# Patient Record
Sex: Female | Born: 1970 | Race: White | Hispanic: No | Marital: Single | Smoking: Current every day smoker
Health system: Southern US, Community
[De-identification: ages and names within clinical notes are randomized; demographics above are authoritative.]

## PROBLEM LIST (undated history)

## (undated) DIAGNOSIS — K219 Gastro-esophageal reflux disease without esophagitis: Secondary | ICD-10-CM

## (undated) DIAGNOSIS — G43909 Migraine, unspecified, not intractable, without status migrainosus: Secondary | ICD-10-CM

## (undated) DIAGNOSIS — F4310 Post-traumatic stress disorder, unspecified: Secondary | ICD-10-CM

## (undated) DIAGNOSIS — M797 Fibromyalgia: Secondary | ICD-10-CM

## (undated) DIAGNOSIS — K8020 Calculus of gallbladder without cholecystitis without obstruction: Secondary | ICD-10-CM

## (undated) DIAGNOSIS — G4730 Sleep apnea, unspecified: Secondary | ICD-10-CM

## (undated) DIAGNOSIS — M1990 Unspecified osteoarthritis, unspecified site: Secondary | ICD-10-CM

## (undated) DIAGNOSIS — I10 Essential (primary) hypertension: Secondary | ICD-10-CM

## (undated) DIAGNOSIS — K56609 Unspecified intestinal obstruction, unspecified as to partial versus complete obstruction: Secondary | ICD-10-CM

## (undated) DIAGNOSIS — K449 Diaphragmatic hernia without obstruction or gangrene: Secondary | ICD-10-CM

## (undated) DIAGNOSIS — F419 Anxiety disorder, unspecified: Secondary | ICD-10-CM

## (undated) DIAGNOSIS — J45909 Unspecified asthma, uncomplicated: Secondary | ICD-10-CM

## (undated) DIAGNOSIS — E119 Type 2 diabetes mellitus without complications: Secondary | ICD-10-CM

## (undated) DIAGNOSIS — F32A Depression, unspecified: Secondary | ICD-10-CM

## (undated) DIAGNOSIS — K602 Anal fissure, unspecified: Secondary | ICD-10-CM

## (undated) DIAGNOSIS — F319 Bipolar disorder, unspecified: Secondary | ICD-10-CM

## (undated) DIAGNOSIS — D649 Anemia, unspecified: Secondary | ICD-10-CM

## (undated) DIAGNOSIS — C55 Malignant neoplasm of uterus, part unspecified: Secondary | ICD-10-CM

## (undated) DIAGNOSIS — N83209 Unspecified ovarian cyst, unspecified side: Secondary | ICD-10-CM

## (undated) HISTORY — PX: APPENDECTOMY: SHX54

## (undated) HISTORY — DX: Gastro-esophageal reflux disease without esophagitis: K21.9

## (undated) HISTORY — DX: Type 2 diabetes mellitus without complications: E11.9

## (undated) HISTORY — DX: Anemia, unspecified: D64.9

## (undated) HISTORY — DX: Anal fissure, unspecified: K60.2

## (undated) HISTORY — DX: Anxiety disorder, unspecified: F41.9

## (undated) HISTORY — DX: Essential (primary) hypertension: I10

## (undated) HISTORY — DX: Malignant neoplasm of uterus, part unspecified: C55

## (undated) HISTORY — DX: Unspecified asthma, uncomplicated: J45.909

## (undated) HISTORY — DX: Diaphragmatic hernia without obstruction or gangrene: K44.9

## (undated) HISTORY — DX: Post-traumatic stress disorder, unspecified: F43.10

## (undated) HISTORY — DX: Unspecified osteoarthritis, unspecified site: M19.90

## (undated) HISTORY — DX: Calculus of gallbladder without cholecystitis without obstruction: K80.20

## (undated) HISTORY — DX: Sleep apnea, unspecified: G47.30

## (undated) HISTORY — PX: DILATION AND CURETTAGE, DIAGNOSTIC / THERAPEUTIC: SUR384

## (undated) HISTORY — DX: Bipolar disorder, unspecified: F31.9

## (undated) HISTORY — PX: PARTIAL HYSTERECTOMY: SHX80

## (undated) HISTORY — DX: Depression, unspecified: F32.A

## (undated) HISTORY — DX: Fibromyalgia: M79.7

## (undated) HISTORY — DX: Unspecified intestinal obstruction, unspecified as to partial versus complete obstruction: K56.609

## (undated) HISTORY — DX: Migraine, unspecified, not intractable, without status migrainosus: G43.909

## (undated) HISTORY — PX: CHOLECYSTECTOMY: SHX55

## (undated) HISTORY — DX: Unspecified ovarian cyst, unspecified side: N83.209

## (undated) HISTORY — PX: ROUX-EN-Y GASTRIC BYPASS: SHX1104

---

## 2005-11-28 DIAGNOSIS — A4902 Methicillin resistant Staphylococcus aureus infection, unspecified site: Secondary | ICD-10-CM

## 2005-11-28 HISTORY — DX: Methicillin resistant Staphylococcus aureus infection, unspecified site: A49.02

## 2005-11-28 HISTORY — PX: HERNIA REPAIR: SHX51

## 2005-11-28 HISTORY — PX: HIATAL HERNIA REPAIR: SHX195

## 2008-11-28 HISTORY — PX: COLECTOMY: SHX59

## 2012-01-23 HISTORY — PX: ANTERIOR FUSION LUMBAR SPINE: SUR629

## 2014-11-07 HISTORY — PX: SPINE SURGERY: SHX786

## 2016-06-21 HISTORY — PX: OTHER SURGICAL HISTORY: SHX169

## 2021-07-19 ENCOUNTER — Other Ambulatory Visit: Payer: Self-pay | Admitting: Neurosurgery

## 2021-07-19 DIAGNOSIS — M549 Dorsalgia, unspecified: Secondary | ICD-10-CM

## 2021-09-04 ENCOUNTER — Ambulatory Visit
Admission: RE | Admit: 2021-09-04 | Discharge: 2021-09-04 | Disposition: A | Discharge status: Home / Self Care | Payer: Self-pay | Source: Ambulatory Visit | Attending: Neurosurgery | Admitting: Neurosurgery

## 2021-09-04 ENCOUNTER — Other Ambulatory Visit: Payer: Self-pay

## 2021-09-04 DIAGNOSIS — M549 Dorsalgia, unspecified: Secondary | ICD-10-CM

## 2021-09-04 IMAGING — MR MR THORACIC SPINE WO/W CM
4 of 8 series · 22 of 48 positions shown · IV contrast (MULTIHANCE)
Comparison: Thoracic spine MRI [DATE].

CLINICAL DATA: Dorsalgia. Additional history provided by scanning
technologist: Patient reports mid back pain and low back pain for
years, bilateral leg weakness.

EXAM:
MRI THORACIC WITHOUT AND WITH CONTRAST
TECHNIQUE: Multiplanar and multiecho pulse sequences of the thoracic spine were
obtained without and with intravenous contrast.
CONTRAST:  20mL MULTIHANCE GADOBENATE DIMEGLUMINE 529 MG/ML IV SOLN

[Series 17: T1 · sagittal · 3.0mm · 1.00mm/px · 4 of 17 slices shown (1 of 2)]
[im 1/17]
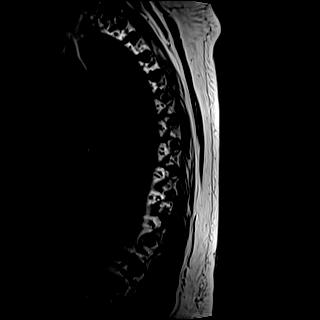
[im 6/17]
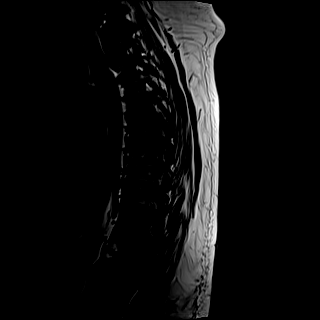
[im 11/17]
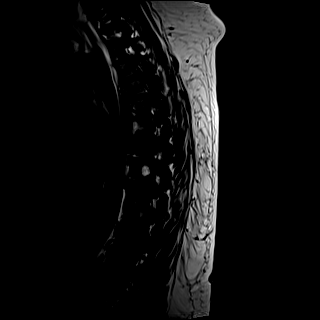
[im 17/17]
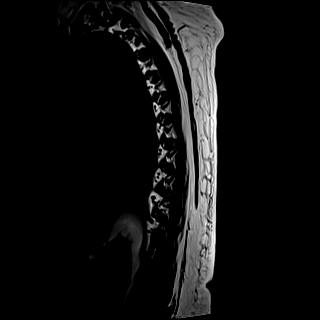

[Series 19: T2 · sagittal · 3.0mm · 1.00mm/px · 4 of 17 slices shown (1 of 2)]
[im 1/17]
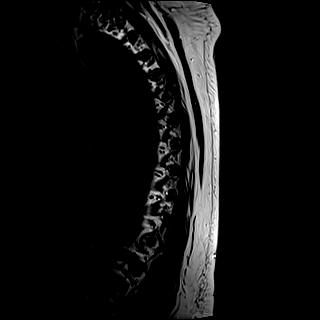
[im 6/17]
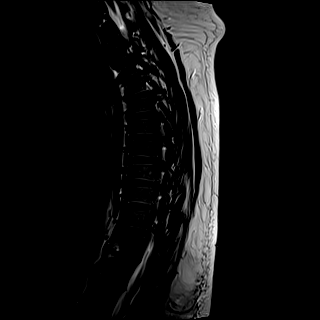
[im 11/17]
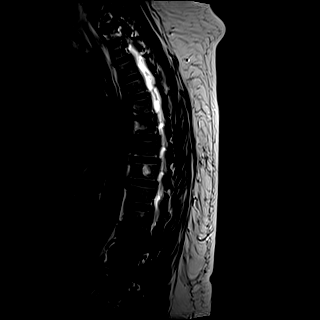
[im 17/17]
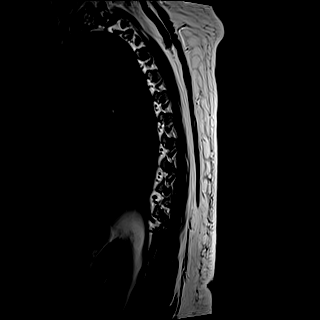

[Series 21: T2 · axial · 4.0mm · 0.28mm/px · z∈[-316,-107]mm · 8 of 40 slices shown (2 of 2)]
[im 1/40]
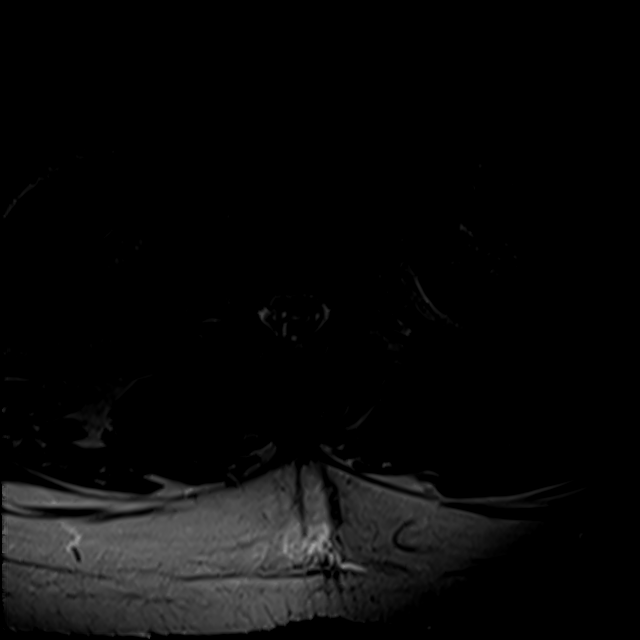
[im 6/40]
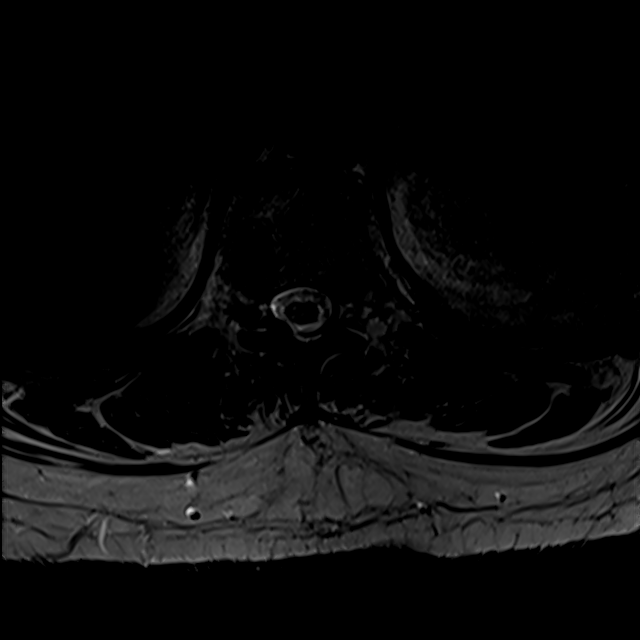
[im 12/40]
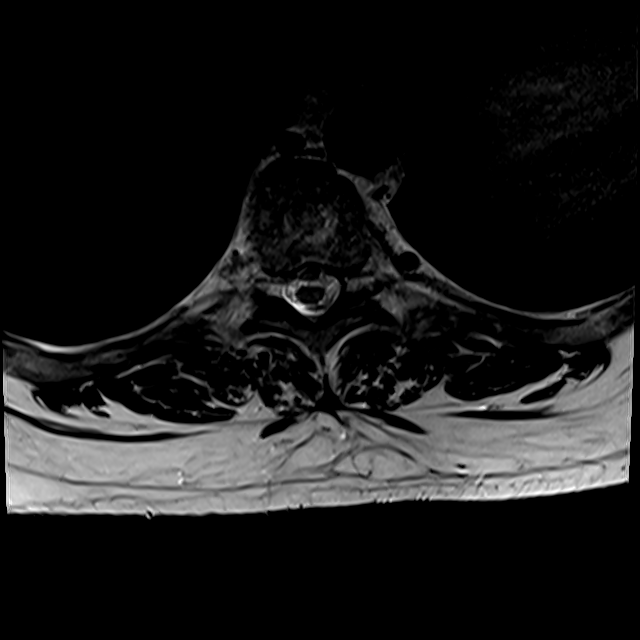
[im 17/40]
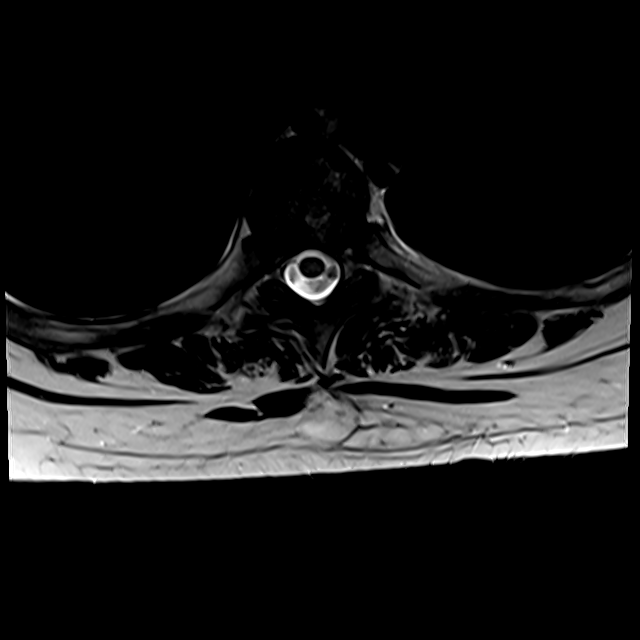
[im 23/40]
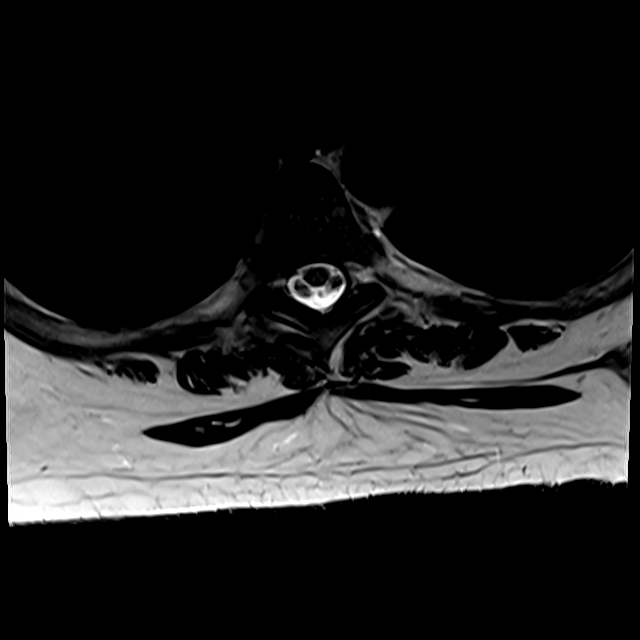
[im 28/40]
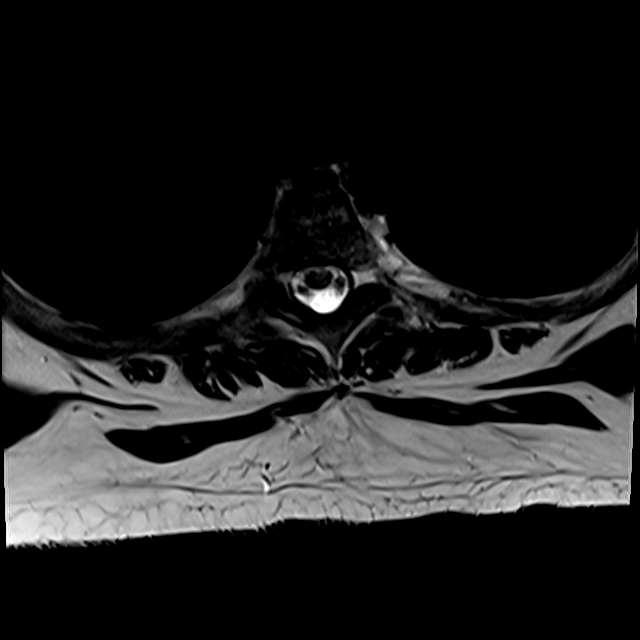
[im 34/40]
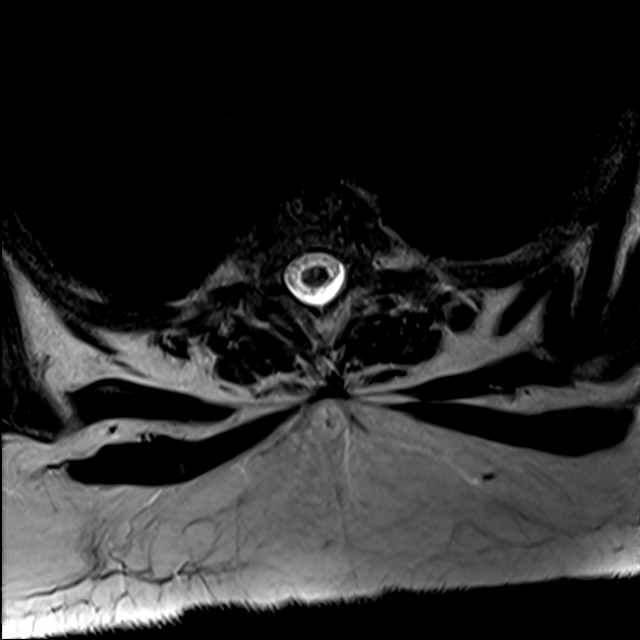
[im 40/40]
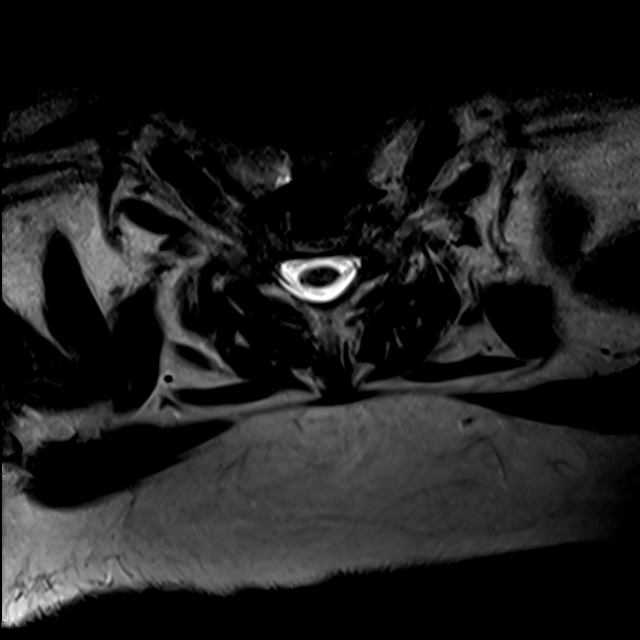

[Series 23: T1 · axial · non-contrast · 4.0mm · 0.56mm/px · z∈[-316,-138]mm · 6 of 40 slices shown (2 of 2)]
[im 1/40]
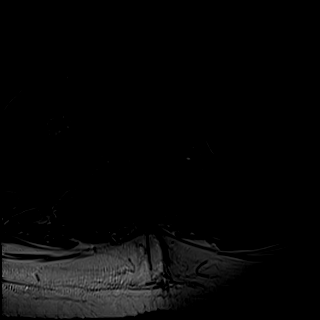
[im 6/40]
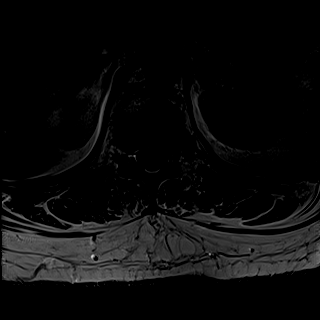
[im 12/40]
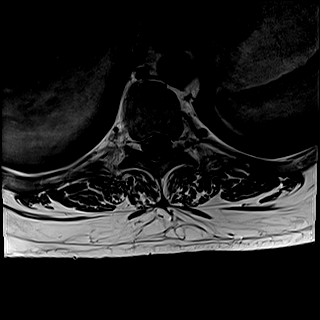
[im 17/40]
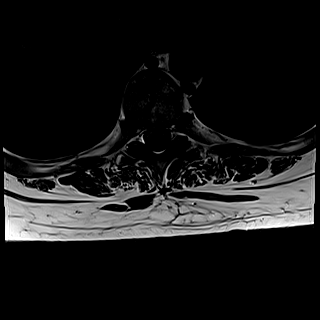
[im 23/40]
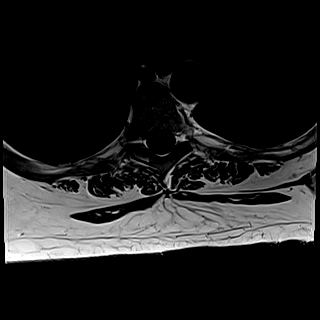
[im 34/40]
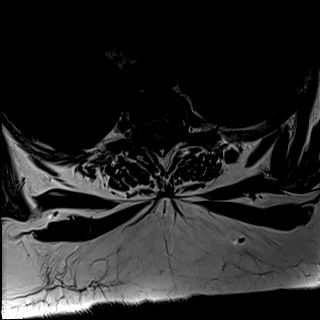

[22 of 48 positions shown; findings below may reference images not displayed]

FINDINGS: Intermittently motion degraded examination. Most notably, there is
mild-to-moderate motion degradation of the axial T2 weighted
sequences.

Alignment: Thoracic dextrocurvature. Slight exaggeration of the
thoracic kyphosis. No significant spondylolisthesis.

Vertebrae: Vertebral body height is maintained. Multilevel vertebral
body hemangiomas. Degenerative endplate irregularity, greatest along
the T7 inferior endplate. No focal suspicious osseous lesion.

Cord: Within the limitations of motion degradation, no spinal cord
signal abnormality is identified. No abnormal cord enhancement.

Paraspinal and other soft tissues: No abnormality identified within
included portions of the thorax or upper abdomen/retroperitoneum.
Paraspinal soft tissues unremarkable.

Disc levels:

Mild-to-moderate disc degeneration at T7-T8 and T11-T12. No more
than mild disc degeneration at the remaining levels.

There are shallow multilevel disc bulges. Mild facet arthrosis
within the lower thoracic spine. Sternal notable level by level
findings, as described below.

At T4-T5, there is a small central disc protrusion. The disc
protrusion focally effaces the ventral thecal sac, contributing to
mild relative spinal canal narrowing, with possible contact upon the
ventral spinal cord. No significant cord deformity.

At T6-T7, there is a broad-based left center disc protrusion. The
disc protrusion focally effaces the ventral thecal sac, contacting
and mildly flattening the left ventral aspect of the spinal cord.
However, the dorsal CSF space is maintained.

At T8-T9, there is a small left center disc protrusion. The disc
protrusion focally effaces the left ventral thecal sac (without
spinal cord mass effect).

At T9-T10, there is a 1.3 x 0.6 cm central disc extrusion with
slight cranial and caudal migration. The disc extrusion focally
effaces the ventral thecal sac, contacting and mildly flattening the
ventral spinal cord. However, the dorsal CSF space is maintained.

No significant foraminal stenosis within the thoracic spine.
IMPRESSION: Motion degraded exam.

Thoracic spondylosis, as outlined and with findings most notably as
follows.

At T9-T10, there is a 1.3 x 0.6 cm central disc extrusion with
slight cranial and caudal migration. The disc extrusion focally
effaces the ventral thecal sac, contacting and mildly flattening the
ventral spinal cord. However, the dorsal CSF space is maintained.

At T4-T5, there is a small central disc protrusion which focally
effaces the ventral thecal sac, resulting in mild spinal canal
narrowing, with possible contact upon the ventral spinal cord.
However, there is no significant cord deformity.

Small left center disc protrusions are also present at T6-T7 and
T8-T9. These disc protrusions focally efface the ventral thecal sac
(without spinal cord mass effect).

No significant foraminal stenosis within the thoracic spine.

Slightly exaggerated thoracic kyphosis.

Thoracic dextrocurvature.

## 2021-09-04 IMAGING — MR MR LUMBAR SPINE WO/W CM
5 of 8 series · 22 of 48 positions shown · IV contrast (multihance)
Comparison: Lumbar radiographs [DATE] (without report).

CLINICAL DATA: Dorsalgia [V1] ([V1]-CM)

EXAM:
MRI LUMBAR SPINE WITHOUT AND WITH CONTRAST
TECHNIQUE: Multiplanar and multiecho pulse sequences of the lumbar spine were
obtained without and with intravenous contrast.
CONTRAST:  20mL MULTIHANCE GADOBENATE DIMEGLUMINE 529 MG/ML IV SOLN

[Series 17: T2 · sagittal · 4.0mm · 0.73mm/px · 3 of 15 slices shown (1 of 3)]
[im 1/15]
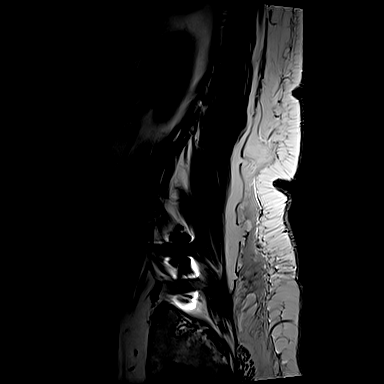
[im 8/15]
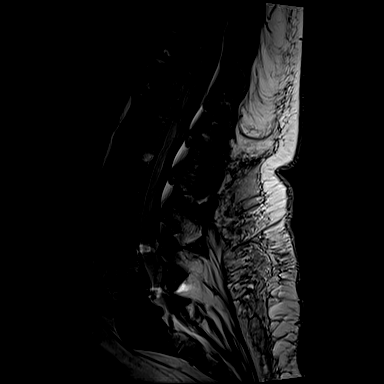
[im 15/15]
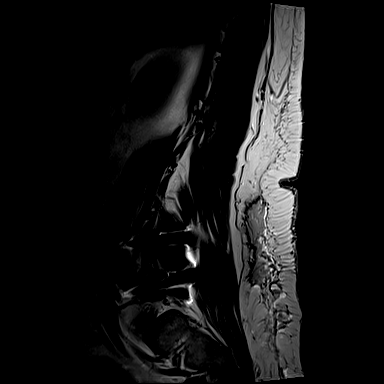

[Series 18: T1 · sagittal · 4.0mm · 0.73mm/px · 3 of 15 slices shown]
[im 1/15]
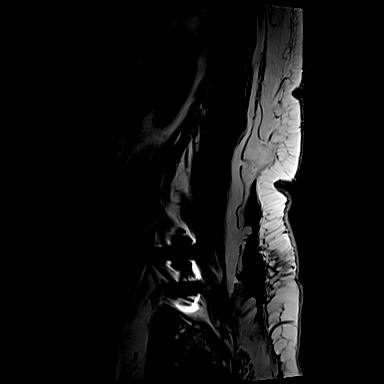
[im 8/15]
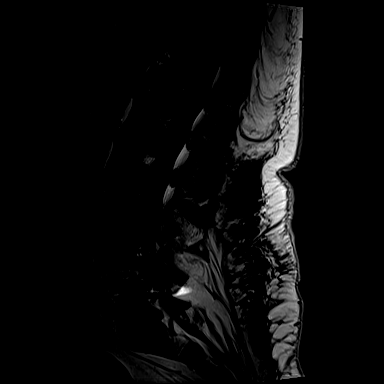
[im 15/15]
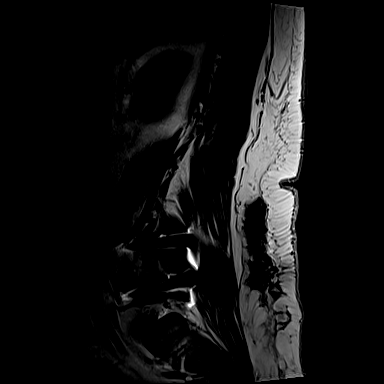

[Series 23: T2 · axial · 4.0mm · 0.28mm/px · z∈[-527,-307]mm · 8 of 41 slices shown (2 of 3)]
[im 1/41]
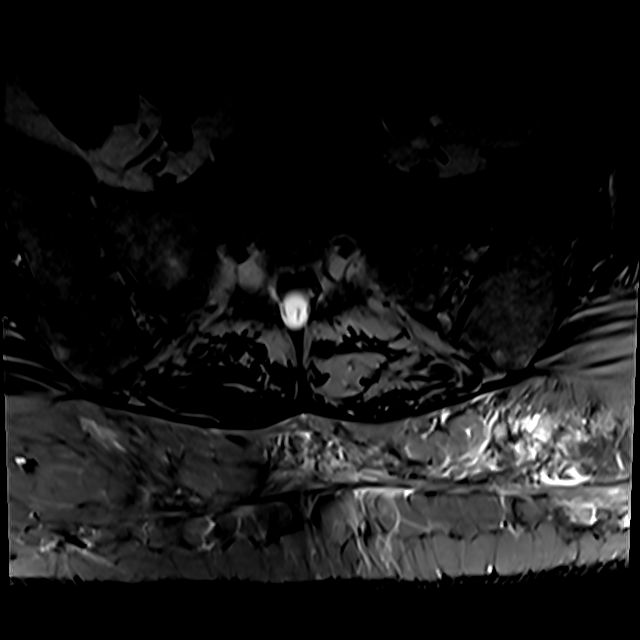
[im 5/41]
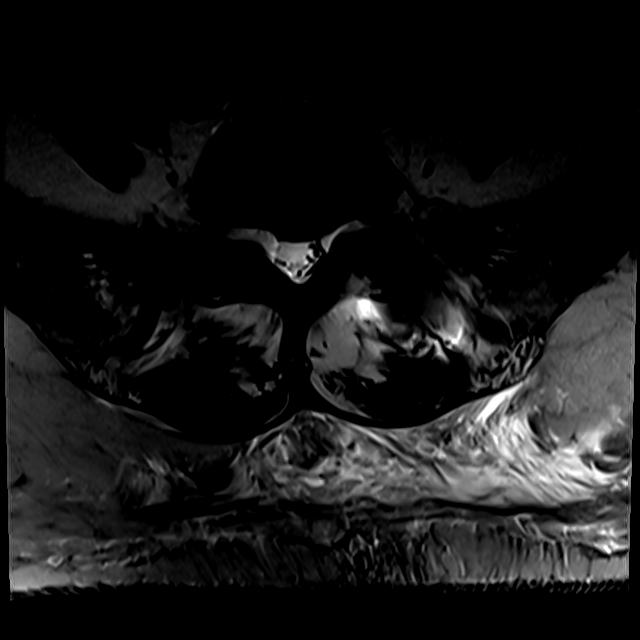
[im 14/41]
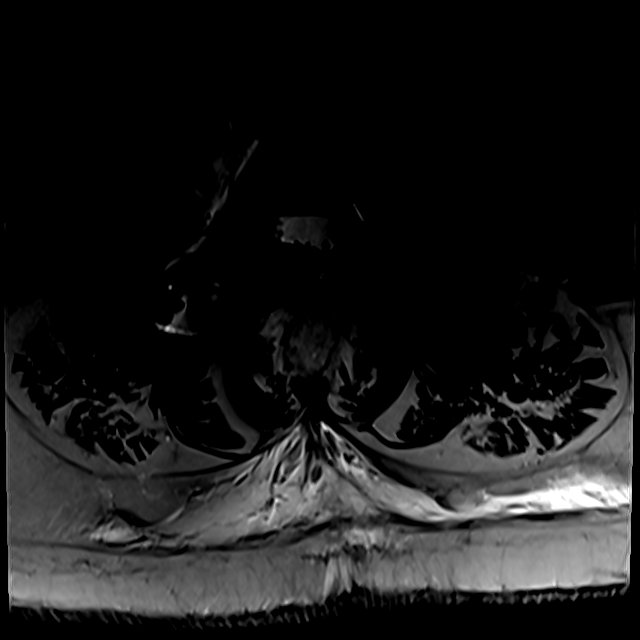
[im 18/41]
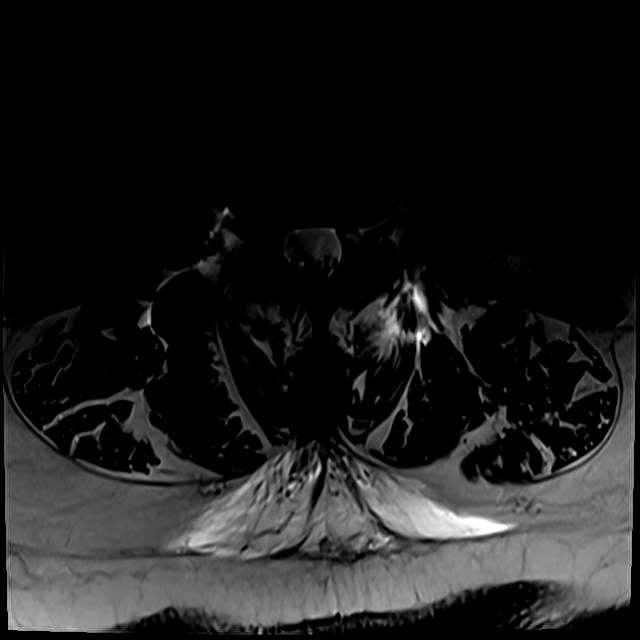
[im 23/41]
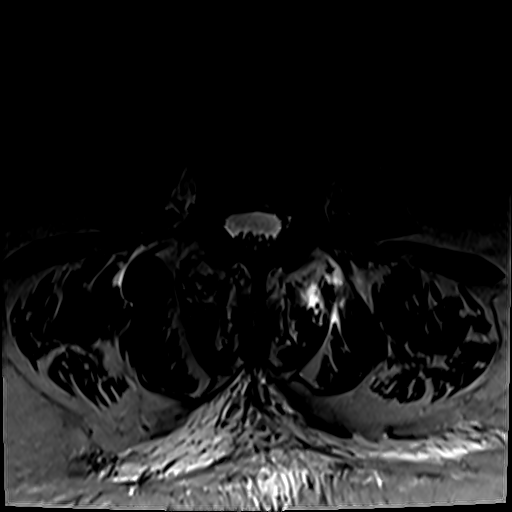
[im 27/41]
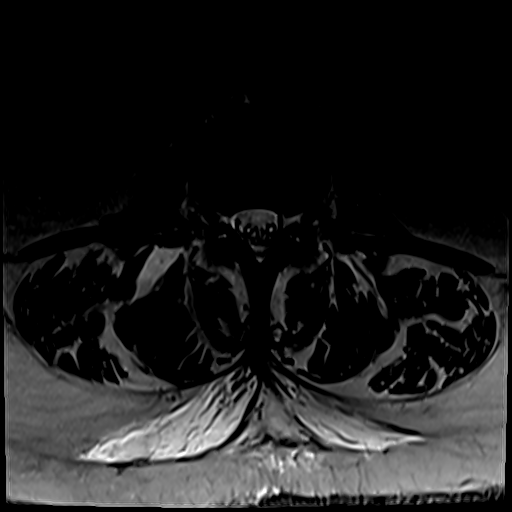
[im 36/41]
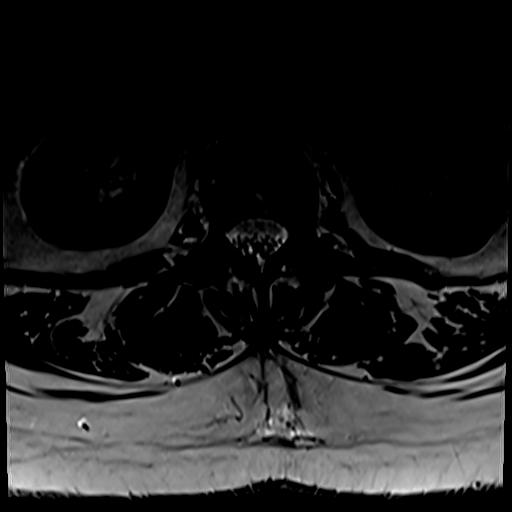
[im 41/41]
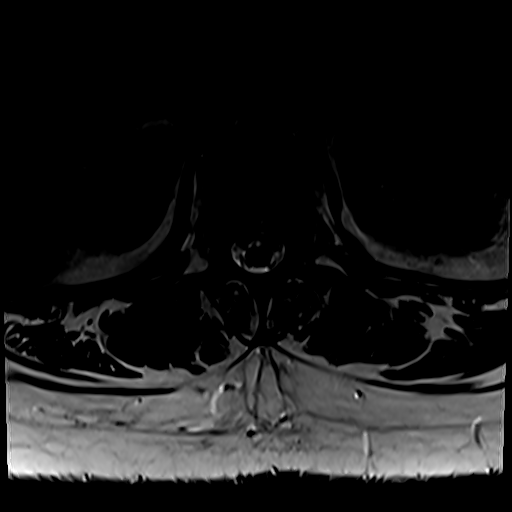

[Series 25: T2 · sagittal · 4.0mm · 0.73mm/px · 4 of 15 slices shown (3 of 3)]
[im 1/15]
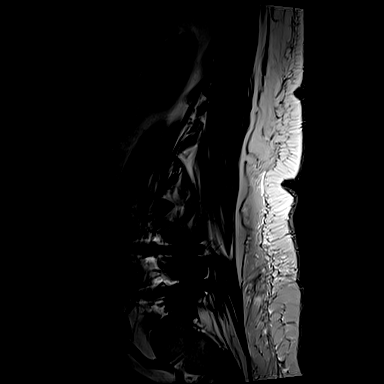
[im 5/15]
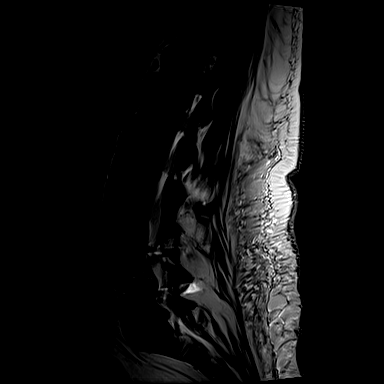
[im 10/15]
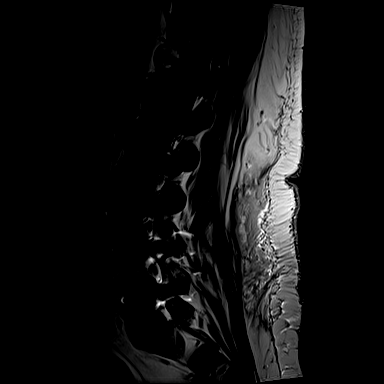
[im 15/15]
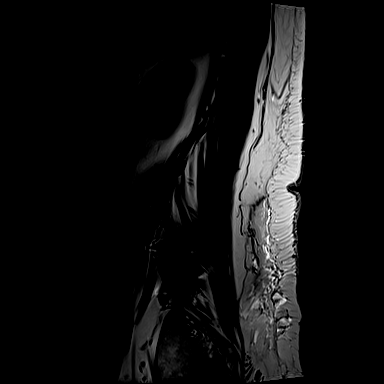

[Series 26: T1 fat-sat post-contrast · sagittal · 4.0mm · 0.73mm/px · 4 of 15 slices shown]
[im 1/15]
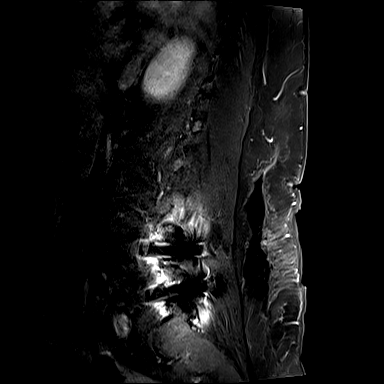
[im 5/15]
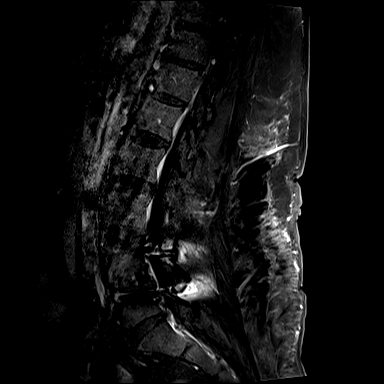
[im 10/15]
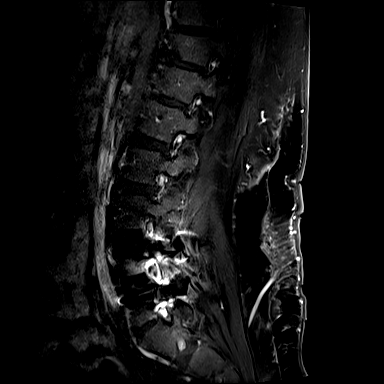
[im 15/15]
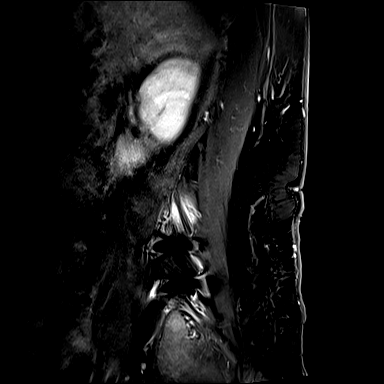

[22 of 48 positions shown; findings below may reference images not displayed]

FINDINGS: Segmentation: There is ectasia in is seen. The inferior-most fully
formed intervertebral disc labeled L5-S1.

Alignment:  No substantial sagittal subluxation.

Vertebrae: L4-L5 PLIF. Vertebral body heights are maintained.
Chronic appearing degenerative/discogenic endplate signal changes
about the anterior T11-T12 disc. No focal marrow edema to suggest
acute fracture discitis/osteomyelitis. Scattered benign vertebral
venous malformations without suspicious bone lesion. No abnormal
enhancement.

Conus medullaris and cauda equina: Conus extends to the L1-L2 level.
Conus appears normal. No abnormal enhancement.

Paraspinal and other soft tissues: Postoperative changes in the
lower posterior paraspinal soft tissues.

Disc levels:

T12-L1: Mild disc bulging and mild facet arthropathy without
significant canal or foraminal stenosis.

L1-L2: Mild disc bulging and mild facet arthropathy without
significant canal or foraminal stenosis.

L2-L3: Mild facet arthropathy without significant canal or foraminal
stenosis.

L3-L4: Slight disc bulging and mild bilateral facet arthropathy and
ligamentum flavum thickening. No significant canal or foraminal
stenosis.

L4-L5: PLIF.  Patent canal and foramina.

L5-S1: Small broad disc bulge with superimposed central disc
protrusion which likely contacts descending right S1 nerve roots
without clear binge mint. Also, left foraminal disc protrusion with
mild to moderate left foraminal stenosis. Prominent epidural fat.
Mild central canal stenosis. No significant right foraminal
stenosis.
IMPRESSION: 1. At L5-S1, central and left foraminal disc protrusions with disc
contacting the descending right S1 nerve roots and contributing to
mild-to-moderate left foraminal stenosis. Mild central canal
stenosis.
2. At L4-L5, PLIF with patent canal and foramina at this level.

## 2021-09-04 MED ORDER — GADOBENATE DIMEGLUMINE 529 MG/ML IV SOLN
20.0000 mL | Freq: Once | INTRAVENOUS | Status: AC | PRN
  Administered 2021-09-04: 20 mL via INTRAVENOUS

## 2021-12-21 ENCOUNTER — Encounter: Payer: Self-pay | Admitting: Family

## 2022-01-03 ENCOUNTER — Encounter: Payer: Self-pay | Admitting: Physician Assistant

## 2022-01-07 ENCOUNTER — Encounter: Payer: Self-pay | Admitting: *Deleted

## 2022-01-13 NOTE — Progress Notes (Signed)
01/14/2022 Julie Henderson [id number] 1971/07/08   ASSESSMENT AND PLAN:   S/P gastric bypass body mass index is 39.93 kg/m.  Anemia On B12/iron but having a hard time tolerating oral iron, if CBC shows anemia can add on B12/iron, may benefit from infusion with gastric bypass history  Dyspepsia EGD/colon [facility] 06/06/2018  Normal post-RYGB anatomy without evidence of marginal ulcer, normal colonoscopy. Suggest 10 year follow up colon, possible capsule endoscopy for anemia but never done. No labs seen, will repeat labs here with TTG/IGA since questionable positive per patient, she is eating gluten.  Will increase PPI to BID continue carafate as needed, will schedule for EGD for evaluation I discussed risks of EGD with patient today, including risk of sedation, bleeding or perforation.  Patient provides understanding and gave verbal consent to proceed.  If negative possible component of gastroparesis with opiods, given patient information- could consider reglan or motegrity for stomach and bowels. -     IgA; Future -     Tissue transglutaminase, IgA; Future -     C-reactive protein; Future -     esomeprazole (NEXIUM) 40 MG capsule; Take 1 capsule (40 mg total) by mouth 2 (two) times daily before a meal.  Nausea and vomiting, unspecified vomiting type -     CBC with Differential/Platelet; Future -     Comprehensive metabolic panel; Future -     esomeprazole (NEXIUM) 40 MG capsule; Take 1 capsule (40 mg total) by mouth 2 (two) times daily before a meal. Get EGD, if negative consider UGI versus gastric emptying study.   Drug-induced constipation Constipation - Increase fiber/ water intake, decrease caffeine, increase activity level, can add  miralax  until BM soft. If not better could consider amitzia or movantik Please go to the hospital if you have severe abdominal pain, vomiting, fever, CP, SOB.     No future appointments.   Patient Care Team: Cobbler, Linward Natal, FNP as PCP -  General (Family Medicine)  HISTORY OF PRESENT ILLNESS: 51 y.o. female referred by No ref. provider found, with a past medical history of uterine cancer, macrocytic anemia with B12 deficiency, bipolar 1 disorder, COPD, PTSD, hypertension, depression, migraines, IBS, peripheral arterial disease and others listed below presents for evaluation of nausea, vomiting, epigastric pain.   Surgical history includes hysterectomy 2001, gastric bypass 2005, appendectomy 2021, cholecystectomy and inguinal hernia repair, L4-L5 fusion, C4-5 ACDF  She was seen by GI in [facility], last seen 12/2018.  She had EGD/colon there 06/06/2018 with Small bowel biopsies were not obtained.  Normal post-RYGB anatomy without evidence of marginal ulcer, normal colonoscopy. Suggest 10 year follow up colon, possible capsule endoscopy but this negative.  Questionable celiac positive.  UGI with small bowel 05/09/2019 normal esophagus, no motility issues, normal anastomosis, small hiatal hernia, GERD was elicited   She states she has dull ache with nausea, occ vomiting with food, no bloody emesis. Worse with any foods.  Worse x 5 months, started Carafate but it is not helping.  She has BM once every 2-3 days, was once a week.  She gets full very quickly.  No melena, hematochezia.  Weight loss 237 to 229 per patient, from Jan 23rd. HR was elevated today, states worse at doctors, at home feels palpations. No chest pain, some SOB. Saw HR Oct/Nov per patient without issue, was on coreg but stopped.  She is on oxycodone for chronic pain, 3-4 x a day. She is on nexium once a day.   Patient is  a 2 pack a day cigarette smoker Has history of chronic pain follows with [place] [facility] Suppose to be on iron but can not take it, last labs done at [facility] with hematology, has follow up end of July.     External labs and notes reviewed this visit: UGI with small bowel 05/09/2019 1. Normal barium tablet passage. 2. The swallowing  mechanism is symmetric. There was no evidence of laryngeal penetration or tracheal aspiration.  3. The esophagus is normally distensible without evidence of filling defect, narrowing, or extrinsic compression. A few tertiary contractions were seen. Otherwise esophageal motility was within normal limits.  4. Contrast passed promptly from the esophagus into the gastric pouch and subsequently across the gastrojejunal anastomosis. 5. Small hiatal hernia. 6. Gastroesophageal reflux was elicited with water siphon maneuver.  CT AB and pelvis 05/09/2019 s/p appendectomy 1. Findings are suspicious of appendicitis. Findings discussed with Dr. Calton Dach at the time of dictation. 2. Additional findings that may require follow-up: None  EGD/Colon 08/29/2018 SURGEON: Dr. Vonda Antigua FELLOW: Dr. Mariea Clonts ANESTHESIA: TIVA PROCEDURE FINDINGS: Normal post-RYGB anatomy without evidence of marginal ulcer, normal colonoscopy SPECIMENS: none EBL: 0 mL PREPARATION RESULTS: 2/3/3 DIVERTICULOSIS: none HEMORRHOIDS: none REPEAT EXAM: 10 years, will need pill cam study to further evaluate for sources of melena  DETAILS OF PROCEDURE: The patient was brought into the endoscopy suite and placed in the left decubitus position. Sedation was given. An endosope was advanced through the oropharynx into the esophagus and then to the level of the gastric pouch. The gastrojejunostomy was identified and intubated. The blind end and the roux limb both appeared normal. The mucosa immediately distal to the anastomosis was examined and there was no evidence of a marginal ulcer. The pouch was examined and appeared normal with no evidence of recent bleeding. The scope was then withdrawn and the GE junction appeared normal. The scope was completely withdrawn and the esophagus appeared normal with no evidence of varices or recent bleeding. We then turned our attention to evaluating for a lower GI source of bleeding. A digital rectal exam was done and  was normal. A well-lubricated Olympus colonoscope was inserted, and without difficulty was maneuvered all the way into the cecal cap. The ileocecal valve was visualized. The TI was also visualized and there were no signs of recent bleeding. Specifically, I did not see any AVMs or inflammation consistent with inflammatory bowel disease. The appendiceal orifice was visualized and the scope was withdrawn. The bowel was examined circumferentially on withdrawal. No AVMs or masses were seen. The scope was retroflexed in the rectum and the internal hemorrhoidal columns appeared normal. The scope was withdrawn. Current Medications:    Current Outpatient Medications (Cardiovascular):    furosemide (LASIX) 40 MG tablet, Take 40-80 mg by mouth daily as needed.  Current Outpatient Medications (Respiratory):    albuterol (VENTOLIN HFA) 108 (90 Base) MCG/ACT inhaler, Inhale 2 puffs into the lungs. Every 4-6 hours as needed   promethazine (PHENERGAN) 25 MG tablet, Take 25 mg by mouth 4 (four) times daily as needed.  Current Outpatient Medications (Analgesics):    NURTEC 75 MG TBDP, Take 1 tablet by mouth every other day.   Oxycodone HCl 10 MG TABS, Take 1 tablet by mouth. Every 4-6 hours  Current Outpatient Medications (Hematological):    cyanocobalamin (,VITAMIN B-12,) 1000 MCG/ML injection, Inject 1,000 mcg into the muscle once a week.  Current Outpatient Medications (Other):    ALPRAZolam (XANAX) 1 MG tablet, Take 1 mg by mouth 4 (four)  times daily as needed.   B-D 3CC LUER-LOK SYR 25GX1" 25G X 1" 3 ML MISC, USE AS DIRECTED with vitiamin b12 injections   gabapentin (NEURONTIN) 600 MG tablet, Take 600 mg by mouth 3 (three) times daily.   Lactobacillus Rhamnosus, GG, (CULTURELLE PO), Take 1 capsule by mouth daily.   LATUDA 60 MG TABS, Take 1 tablet by mouth daily.   melatonin 5 MG TABS, Take 5 mg by mouth at bedtime.   naloxone (NARCAN) 0.4 MG/ML injection, Inject into the vein.   potassium chloride  (KLOR-CON) 10 MEQ tablet, Take 10 mEq by mouth as needed.   Probiotic Product (PROBIOTIC MULTI-ENZYME PO), Take 1 tablet by mouth daily.   QUEtiapine (SEROQUEL) 100 MG tablet, Take 100 mg by mouth at bedtime.   sucralfate (CARAFATE) 1 g tablet, Take 1 g by mouth 4 (four) times daily.   Vitamin D, Ergocalciferol, (DRISDOL) 1.25 MG (50000 UNIT) CAPS capsule, Take 50,000 Units by mouth 2 (two) times a week.   esomeprazole (NEXIUM) 40 MG capsule, Take 1 capsule (40 mg total) by mouth 2 (two) times daily before a meal.  Medical History:  Past Medical History:  Diagnosis Date   Anal fissure    Anemia    Anxiety    Asthma    Bipolar disorder (HCC)    Depression    Diabetes ([place])    DJD (degenerative joint disease)    DJD (degenerative joint disease)    Fibromyalgia    Gallstones    GERD (gastroesophageal reflux disease)    Hiatal hernia    Hypertension    Migraine    MRSA (methicillin resistant Staphylococcus aureus) 2007   Ovarian cyst    PTSD (post-traumatic stress disorder)    Sleep apnea    Small bowel obstruction (HCC)    Small bowel obstruction (HCC)    Uterine cancer ([place])    Allergies:  Allergies  Allergen Reactions   Buprenorphine Hives, Shortness Of Breath and Swelling    Other reaction(s): Other - See Comments wheezing    Morphine Anaphylaxis    Pt had swelling in her face and all over her body . This was in pill form    Neurontin [Gabapentin] Shortness Of Breath   Ciprofloxacin Rash   Nitrofurantoin Hives   Flector [Diclofenac Epolamine] Nausea And Vomiting     Surgical History:  She  has a past surgical history that includes Roux-en-Y Gastric Bypass; Spine surgery (11/07/2014); Cesarean section; Cholecystectomy; Colectomy (2010); Hiatal hernia repair (2007); Partial hysterectomy; Dilation and curettage, diagnostic / therapeutic; Anterior fusion lumbar spine (01/23/2012); spine lumbar direct lateral interbody fusion (06/21/2016); and Appendectomy. Family  History:  Her family history includes Anemia in her mother and sister; Anxiety disorder in her daughter and daughter; Depression in her daughter and daughter; Diabetes in her brother; Hypertension in her brother; Obesity in her mother. Social History:   reports that she has been smoking cigarettes. She has never used smokeless tobacco. She reports that she does not currently use alcohol. No history on file for drug use.  REVIEW OF SYSTEMS  : All other systems reviewed and negative except where noted in the History of Present Illness.   PHYSICAL EXAM: BP (!) 124/94 (BP Location: Left Arm, Patient Position: Sitting, Cuff Size: Normal)    Pulse (!) 148    Ht 5' 3.5" (1.613 m) Comment: height measured without shoes   Wt 229 lb (103.9 kg)    BMI 39.93 kg/m  General:   Pleasant, well developed female  in no acute distress Head:  Normocephalic and atraumatic. Eyes: sclerae anicteric,conjunctive pale  Heart:  tachycardia, regular rhythm, no MRG Pulm: Clear anteriorly; no wheezing Abdomen:  Soft, Obese AB, skin exam scars- well healed vertical scar from epigastrium to suprapubic , Sluggish bowel sounds. mild tenderness  diffuse AB pain, worse RLQ and LUQ. Marland Kitchen With guarding and Without rebound, without hepatomegaly. Extremities:  Without edema. Msk:  Symmetrical without gross deformities. Peripheral pulses intact.  Neurologic:  Alert and  oriented x4;  grossly normal neurologically. Skin:   Dry and intact without significant lesions or rashes. Psychiatric: Demonstrates good judgement and reason without abnormal affect or behaviors.   Vladimir Crofts, PA-C 2:47 PM

## 2022-01-14 ENCOUNTER — Encounter: Payer: Self-pay | Admitting: Physician Assistant

## 2022-01-14 ENCOUNTER — Ambulatory Visit: Payer: Medicare Other | Admitting: Physician Assistant

## 2022-01-14 VITALS — BP 124/94 | HR 148 | Ht 63.5 in | Wt 229.0 lb

## 2022-01-14 DIAGNOSIS — R112 Nausea with vomiting, unspecified: Secondary | ICD-10-CM | POA: Diagnosis not present

## 2022-01-14 DIAGNOSIS — K5903 Drug induced constipation: Secondary | ICD-10-CM

## 2022-01-14 DIAGNOSIS — R1013 Epigastric pain: Secondary | ICD-10-CM

## 2022-01-14 DIAGNOSIS — Z9884 Bariatric surgery status: Secondary | ICD-10-CM | POA: Diagnosis not present

## 2022-01-14 MED ORDER — ESOMEPRAZOLE MAGNESIUM 40 MG PO CPDR: 40.0000 mg | DELAYED_RELEASE_CAPSULE | Freq: Two times a day (BID) | ORAL | 3 refills | Status: DC

## 2022-01-14 NOTE — Progress Notes (Signed)
Addendum: Reviewed and agree with assessment and management plan. Kyani Simkin M, MD  

## 2022-01-14 NOTE — Patient Instructions (Addendum)
You have been scheduled for an endoscopy. Please follow written instructions given to you at your visit today. If you use inhalers (even only as needed), please bring them with you on the day of your procedure.   Increase nexium twice a day Continue carafate as needed.   Miralax is an osmotic laxative.  It only brings more water into the stool.  This is safe to take daily.  Can take up to 17 gram of miralax twice a day.  Mix with juice or coffee.  Start 1 capful at night for 3-4 days and reassess your response in 3-4 days.  You can increase and decrease the dose based on your response.  Remember, it can take up to 3-4 days to take effect OR for the effects to wear off.   I often pair this with benefiber in the morning to help assure the stool is not too loose.   Please go to the ER if you have any severe AB pain, unable to hold down food/water, blood in stool or vomit, chest pain, shortness of breath, or any worsening symptoms.    If you are age 52 or older, your body mass index should be between 23-30. Your Body mass index is 39.93 kg/m. If this is out of the aforementioned range listed, please consider follow up with your Primary Care Provider.  If you are age 52 or younger, your body mass index should be between 19-25. Your Body mass index is 39.93 kg/m. If this is out of the aformentioned range listed, please consider follow up with your Primary Care Provider.   ________________________________________________________  The [facility] providers would like to encourage you to use [facility] to communicate with providers for non-urgent requests or questions.  Due to long hold times on the telephone, sending your provider a message by [facility] may be a faster and more efficient way to get a response.  Please allow 48 business hours for a response.  Please remember that this is for non-urgent requests.  _______________________________________________________ Due to recent changes in  healthcare laws, you may see the results of your imaging and laboratory studies on MyChart before your provider has had a chance to review them.  We understand that in some cases there may be results that are confusing or concerning to you. Not all laboratory results come back in the same time frame and the provider may be waiting for multiple results in order to interpret others.  Please give [place] 48 hours in order for your provider to thoroughly review all the results before contacting the office for clarification of your results.    Gastroparesis Please do small frequent meals like 4-6 meals a day.  Eat and drink liquids at separate times.  Avoid high fiber foods, cook your vegetables, avoid high fat food.  Suggest spreading protein throughout the day (greek yogurt, glucerna, soft meat, milk, eggs) Choose soft foods that you can mash with a fork When you are more symptomatic, change to pureed foods foods and liquids.  Consider reading "Living well with Gastroparesis" by Lambert Keto Gastroparesis is a condition in which food takes longer than normal to empty from the stomach. This condition is also known as delayed gastric emptying. It is usually a long-term (chronic) condition. There is no cure, but there are treatments and things that you can do at home to help relieve symptoms. Treating the underlying condition that causes gastroparesis can also help relieve symptoms What are the causes? In many cases, the cause of  this condition is not known. Possible causes include: A hormone (endocrine) disorder, such as hypothyroidism or diabetes. A nervous system disease, such as Parkinson's disease or multiple sclerosis. Cancer, infection, or surgery that affects the stomach or vagus nerve. The vagus nerve runs from your chest, through your neck, and to the lower part of your brain. A connective tissue disorder, such as scleroderma. Certain medicines. What increases the risk? You are more likely  to develop this condition if: You have certain disorders or diseases. These may include: An endocrine disorder. An eating disorder. Amyloidosis. Scleroderma. Parkinson's disease. Multiple sclerosis. Cancer or infection of the stomach or the vagus nerve. You have had surgery on your stomach or vagus nerve. You take certain medicines. You are female. What are the signs or symptoms? Symptoms of this condition include: Feeling full after eating very little or a loss of appetite. Nausea, vomiting, or heartburn. Bloating of your abdomen. Inconsistent blood sugar (glucose) levels on blood tests. Unexplained weight loss. Acid from the stomach coming up into the esophagus (gastroesophageal reflux). Sudden tightening (spasm) of the stomach, which can be painful. Symptoms may come and go. Some people may not notice any symptoms. How is this diagnosed? This condition is diagnosed with tests, such as: Tests that check how long it takes food to move through the stomach and intestines. These tests include: Upper gastrointestinal (GI) series. For this test, you drink a liquid that shows up well on X-rays, and then X-rays are taken of your intestines. Gastric emptying scintigraphy. For this test, you eat food that contains a small amount of radioactive material, and then scans are taken. Wireless capsule GI monitoring system. For this test, you swallow a pill (capsule) that records information about how foods and fluid move through your stomach. Gastric manometry. For this test, a tube is passed down your throat and into your stomach to measure electrical and muscular activity. Endoscopy. For this test, a long, thin tube with a camera and light on the end is passed down your throat and into your stomach to check for problems in your stomach lining. Ultrasound. This test uses sound waves to create images of the inside of your body. This can help rule out gallbladder disease or pancreatitis as a cause of  your symptoms. How is this treated? There is no cure for this condition, but treatment and home care may relieve symptoms. Treatment may include: Treating the underlying cause. Managing your symptoms by making changes to your diet and exercise habits. Taking medicines to control nausea and vomiting and to stimulate stomach muscles. Getting food through a feeding tube in the hospital. This may be done in severe cases. Having surgery to insert a device called a gastric electrical stimulator into your body. This device helps improve stomach emptying and control nausea and vomiting. Follow these instructions at home: Take over-the-counter and prescription medicines only as told by your health care provider. Follow instructions from your health care provider about eating or drinking restrictions. Your health care provider may recommend that you: Eat smaller meals more often. Eat low-fat foods. Eat low-fiber forms of high-fiber foods. For example, eat cooked vegetables instead of raw vegetables. Have only liquid foods instead of solid foods. Liquid foods are easier to digest. Drink enough fluid to keep your urine pale yellow. Exercise as often as told by your health care provider. Keep all follow-up visits. This is important. Contact a health care provider if you: Notice that your symptoms do not improve with treatment. Have new  symptoms. Get help right away if you: Have severe pain in your abdomen that does not improve with treatment. Have nausea that is severe or does not go away. Vomit every time you drink fluids. Summary Gastroparesis is a long-term (chronic) condition in which food takes longer than normal to empty from the stomach. Symptoms include nausea, vomiting, heartburn, bloating of your abdomen, and loss of appetite. Eating smaller portions, low-fat foods, and low-fiber forms of high-fiber foods may help you manage your symptoms. Get help right away if you have severe pain in your  abdomen. This information is not intended to replace advice given to you by your health care provider. Make sure you discuss any questions you have with your health care provider. Document Revised: 03/23/2020 Document Reviewed: 03/23/2020 Elsevier Patient Education  2021 [employer].

## 2022-01-14 NOTE — Addendum Note (Signed)
Addended by: Larina Bras on: 01/14/2022 06:36 PM   Modules accepted: Orders

## 2022-01-18 ENCOUNTER — Ambulatory Visit (AMBULATORY_SURGERY_CENTER): Payer: Medicare Other | Admitting: Internal Medicine

## 2022-01-18 ENCOUNTER — Other Ambulatory Visit (INDEPENDENT_AMBULATORY_CARE_PROVIDER_SITE_OTHER): Payer: Medicare Other

## 2022-01-18 ENCOUNTER — Encounter: Payer: Self-pay | Admitting: Internal Medicine

## 2022-01-18 VITALS — BP 123/76 | HR 95 | Temp 97.3°F | Resp 18 | Ht 63.5 in | Wt 229.0 lb

## 2022-01-18 DIAGNOSIS — R1013 Epigastric pain: Secondary | ICD-10-CM

## 2022-01-18 DIAGNOSIS — R112 Nausea with vomiting, unspecified: Secondary | ICD-10-CM

## 2022-01-18 DIAGNOSIS — K449 Diaphragmatic hernia without obstruction or gangrene: Secondary | ICD-10-CM

## 2022-01-18 DIAGNOSIS — K319 Disease of stomach and duodenum, unspecified: Secondary | ICD-10-CM

## 2022-01-18 LAB — COMPREHENSIVE METABOLIC PANEL
ALT: 31 U/L (ref 0–35)
AST: 51 U/L — ABNORMAL HIGH (ref 0–37)
Albumin: 3.1 g/dL — ABNORMAL LOW (ref 3.5–5.2)
Alkaline Phosphatase: 163 U/L — ABNORMAL HIGH (ref 39–117)
BUN: 3 mg/dL — ABNORMAL LOW (ref 6–23)
CO2: 30 mEq/L (ref 19–32)
Calcium: 9.1 mg/dL (ref 8.4–10.5)
Chloride: 99 mEq/L (ref 96–112)
Creatinine, Ser: 0.88 mg/dL (ref 0.40–1.20)
GFR: 75.99 mL/min (ref >60.00)
Glucose, Bld: 88 mg/dL (ref 70–99)
Potassium: 4 mEq/L (ref 3.5–5.1)
Sodium: 138 mEq/L (ref 135–145)
Total Bilirubin: 0.6 mg/dL (ref 0.2–1.2)
Total Protein: 7.2 g/dL (ref 6.0–8.3)

## 2022-01-18 LAB — CBC WITH DIFFERENTIAL/PLATELET
Basophils Absolute: 0.3 10*3/uL — ABNORMAL HIGH (ref 0.0–0.1)
Basophils Relative: 3.2 % — ABNORMAL HIGH (ref 0.0–3.0)
Eosinophils Absolute: 0.2 10*3/uL (ref 0.0–0.7)
Eosinophils Relative: 2.1 % (ref 0.0–5.0)
HCT: 41 % (ref 36.0–46.0)
Hemoglobin: 13.6 g/dL (ref 12.0–15.0)
Lymphocytes Relative: 39.7 % (ref 12.0–46.0)
Lymphs Abs: 3.1 10*3/uL (ref 0.7–4.0)
MCHC: 33.3 g/dL (ref 30.0–36.0)
MCV: 91.4 fl (ref 78.0–100.0)
Monocytes Absolute: 0.5 10*3/uL (ref 0.1–1.0)
Monocytes Relative: 6.7 % (ref 3.0–12.0)
Neutro Abs: 3.8 10*3/uL (ref 1.4–7.7)
Neutrophils Relative %: 48.3 % (ref 43.0–77.0)
Platelets: 440 10*3/uL — ABNORMAL HIGH (ref 150.0–400.0)
RBC: 4.48 Mil/uL (ref 3.87–5.11)
RDW: 16.4 % — ABNORMAL HIGH (ref 11.5–15.5)
WBC: 7.9 10*3/uL (ref 4.0–10.5)

## 2022-01-18 LAB — C-REACTIVE PROTEIN: CRP: <1 mg/dL (ref 0.5–20.0)

## 2022-01-18 MED ORDER — SODIUM CHLORIDE 0.9 % IV SOLN: 500.0000 mL | Freq: Once | INTRAVENOUS | Status: DC

## 2022-01-18 NOTE — Op Note (Signed)
[place] Patient Name: Julie Henderson Procedure Date: 01/18/2022 11:32 AM MRN: [id number] Endoscopist: Jerene Bears , MD Age: 51 Referring MD:  Date of Birth: [birth year] Gender: Female Account #: [id number] Procedure:                Upper GI endoscopy Indications:              Dyspepsia, nausea and vomiting, reportedly + celiac                            Ab at outside facility (repeat TTG pending) Medicines:                Monitored Anesthesia Care Procedure:                Pre-Anesthesia Assessment:                           - Prior to the procedure, a History and Physical                            was performed, and patient medications and                            allergies were reviewed. The patient's tolerance of                            previous anesthesia was also reviewed. The risks                            and benefits of the procedure and the sedation                            options and risks were discussed with the patient.                            All questions were answered, and informed consent                            was obtained. Prior Anticoagulants: The patient has                            taken no previous anticoagulant or antiplatelet                            agents. ASA Grade Assessment: III - A patient with                            severe systemic disease. After reviewing the risks                            and benefits, the patient was deemed in                            satisfactory condition to undergo the procedure.  After obtaining informed consent, the endoscope was                            passed under direct vision. Throughout the                            procedure, the patient's blood pressure, pulse, and                            oxygen saturations were monitored continuously. The                            GIF [id number] #[id number] was introduced through the                            mouth, and  advanced to the efferent jejunal loop.                            The upper GI endoscopy was accomplished without                            difficulty. The patient tolerated the procedure                            well. Scope In: Scope Out: Findings:                 Normal mucosa was found in the entire esophagus.                           A 2 cm hiatal hernia was present.                           Evidence of a Roux-en-Y gastrojejunostomy was                            found. The gastrojejunal anastomosis was                            characterized by healthy appearing mucosa. This was                            traversed. The pouch was characterized by healthy                            appearing mucosa. Given dyspepsia multiple biopsies                            obtained to exclude H. Pylori.                           The examined jejunum was normal. Biopsies for                            histology were taken with a cold forceps for  evaluation of celiac disease. Complications:            No immediate complications. Estimated Blood Loss:     Estimated blood loss was minimal. Impression:               - Normal mucosa was found in the entire esophagus.                           - 2 cm hiatal hernia.                           - Roux-en-Y gastrojejunostomy with gastrojejunal                            anastomosis characterized by healthy appearing                            mucosa. Gastric biopsies obtained.                           - Normal examined jejunum. Biopsied. Recommendation:           - Patient has a contact number available for                            emergencies. The signs and symptoms of potential                            delayed complications were discussed with the                            patient. Return to normal activities tomorrow.                            Written discharge instructions were provided to the                             patient.                           - Resume previous diet.                           - Continue present medications.                           - Await pathology results. Jerene Bears, MD 01/18/2022 12:01:07 PM This report has been signed electronically.

## 2022-01-18 NOTE — Progress Notes (Signed)
A and O x3. Report to RN. Tolerated MAC anesthesia well.Gums unchanged after procedure. 

## 2022-01-18 NOTE — Progress Notes (Signed)
Patient seen recently in clinic by Vicie Mutters, PA-C Please see that note for details, dated 01/15/2012 Plan EGD today and she remains appropriate for upper endoscopy in the outpatient ambulatory setting.

## 2022-01-18 NOTE — Patient Instructions (Signed)
YOU HAD AN ENDOSCOPIC PROCEDURE TODAY AT THE [facility]:   Refer to the procedure report that was given to you for any specific questions about what was found during the examination.  If the procedure report does not answer your questions, please call your gastroenterologist to clarify.  If you requested that your care partner not be given the details of your procedure findings, then the procedure report has been included in a sealed envelope for you to review at your convenience later.  YOU SHOULD EXPECT: Some feelings of bloating in the abdomen. Passage of more gas than usual.  Walking can help get rid of the air that was put into your GI tract during the procedure and reduce the bloating. If you had a lower endoscopy (such as a colonoscopy or flexible sigmoidoscopy) you may notice spotting of blood in your stool or on the toilet paper. If you underwent a bowel prep for your procedure, you may not have a normal bowel movement for a few days.  Please Note:  You might notice some irritation and congestion in your nose or some drainage.  This is from the oxygen used during your procedure.  There is no need for concern and it should clear up in a day or so.  SYMPTOMS TO REPORT IMMEDIATELY:    Following upper endoscopy (EGD)  Vomiting of blood or coffee ground material  New chest pain or pain under the shoulder blades  Painful or persistently difficult swallowing  New shortness of breath  Fever of 100F or higher  Black, tarry-looking stools  For urgent or emergent issues, a gastroenterologist can be reached at any hour by calling [phone]. Do not use MyChart messaging for urgent concerns.    DIET:  We do recommend a small meal at first, but then you may proceed to your regular diet.  Drink plenty of fluids but you should avoid alcoholic beverages for 24 hours.  ACTIVITY:  You should plan to take it easy for the rest of today and you should NOT DRIVE or use heavy machinery  until tomorrow (because of the sedation medicines used during the test).    FOLLOW UP: Our staff will call the number listed on your records 48-72 hours following your procedure to check on you and address any questions or concerns that you may have regarding the information given to you following your procedure. If we do not reach you, we will leave a message.  We will attempt to reach you two times.  During this call, we will ask if you have developed any symptoms of COVID 19. If you develop any symptoms (ie: fever, flu-like symptoms, shortness of breath, cough etc.) before then, please call [phone].  If you test positive for Covid 19 in the 2 weeks post procedure, please call and report this information to us.    If any biopsies were taken you will be contacted by phone or by letter within the next 1-3 weeks.  Please call us at [phone] if you have not heard about the biopsies in 3 weeks.    SIGNATURES/CONFIDENTIALITY: You and/or your care partner have signed paperwork which will be entered into your electronic medical record.  These signatures attest to the fact that that the information above on your After Visit Summary has been reviewed and is understood.  Full responsibility of the confidentiality of this discharge information lies with you and/or your care-partner. 

## 2022-01-19 LAB — IGA: Immunoglobulin A: 574 mg/dL — ABNORMAL HIGH (ref 47–310)

## 2022-01-19 LAB — TISSUE TRANSGLUTAMINASE, IGA: (tTG) Ab, IgA: <1 U/mL

## 2022-01-20 ENCOUNTER — Other Ambulatory Visit: Payer: Self-pay

## 2022-01-20 ENCOUNTER — Telehealth: Payer: Self-pay

## 2022-01-20 DIAGNOSIS — R748 Abnormal levels of other serum enzymes: Secondary | ICD-10-CM

## 2022-01-20 DIAGNOSIS — R7989 Other specified abnormal findings of blood chemistry: Secondary | ICD-10-CM

## 2022-01-20 NOTE — Telephone Encounter (Signed)
°  Follow up Call-  Call back number 01/18/2022  Post procedure Call Back phone  # [phone]  Permission to leave phone message Yes     Patient questions:  Do you have a fever, pain , or abdominal swelling? No. Pain Score  0 *  Have you tolerated food without any problems? Yes.    Have you been able to return to your normal activities? Yes.    Do you have any questions about your discharge instructions: Diet   No. Medications  No. Follow up visit  No.  Do you have questions or concerns about your Care? Yes.   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  Advised patient to call surgeons office who performed procedure with this f/u question, she verbalized understandig.  Actions: * If pain score is 4 or above: No action needed, pain <4.  Have you developed a fever since your procedure? no  2.   Have you had an respiratory symptoms (SOB or cough) since your procedure? no  3.   Have you tested positive for COVID 19 since your procedure no  4.   Have you had any family members/close contacts diagnosed with the COVID 19 since your procedure?  no   If yes to any of these questions please route to Joylene John, RN and Joella Prince, RN

## 2022-01-24 ENCOUNTER — Encounter: Payer: Self-pay | Admitting: Internal Medicine

## 2022-02-07 ENCOUNTER — Other Ambulatory Visit: Payer: Self-pay

## 2022-02-07 ENCOUNTER — Ambulatory Visit (HOSPITAL_COMMUNITY)
Admission: RE | Admit: 2022-02-07 | Discharge: 2022-02-07 | Disposition: A | Discharge status: Home / Self Care | Payer: Medicare Other | Source: Ambulatory Visit | Attending: Physician Assistant | Admitting: Physician Assistant

## 2022-02-07 DIAGNOSIS — R7989 Other specified abnormal findings of blood chemistry: Secondary | ICD-10-CM | POA: Insufficient documentation

## 2022-02-07 DIAGNOSIS — R748 Abnormal levels of other serum enzymes: Secondary | ICD-10-CM | POA: Diagnosis present

## 2022-02-07 IMAGING — US US ABDOMEN LIMITED
1 series · 15 of 25 positions shown · non-contrast
Comparison: None.

CLINICAL DATA: Elevated platelets and liver enzymes.

EXAM:
ULTRASOUND ABDOMEN LIMITED RIGHT UPPER QUADRANT

[Series 1: us abdomen limited ruq mc & wl · 15 of 31 slices shown]
[im 1/31]
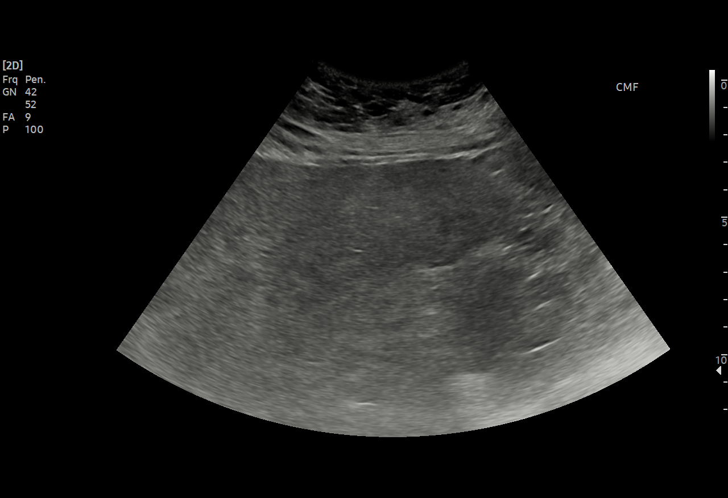
[im 3/31]
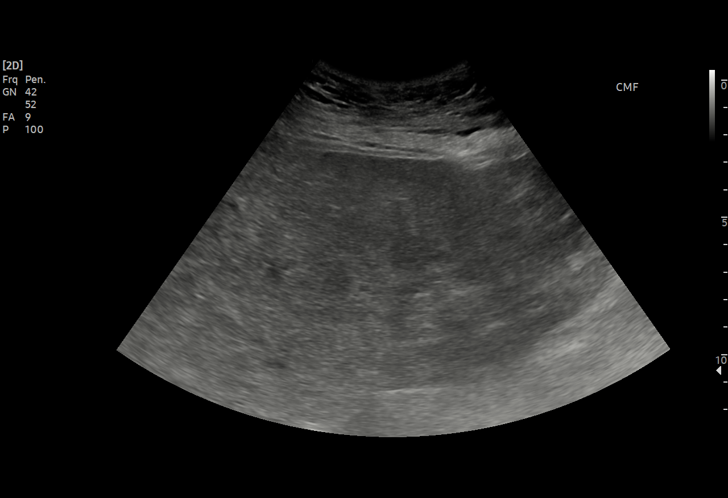
[im 6/31]
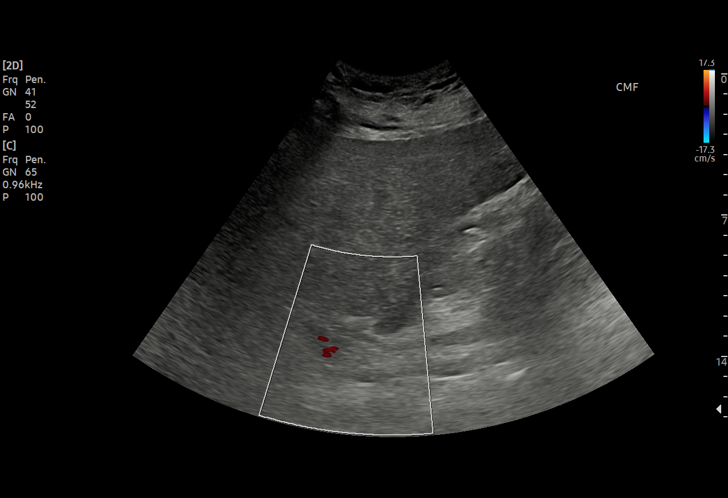
[im 7/31]
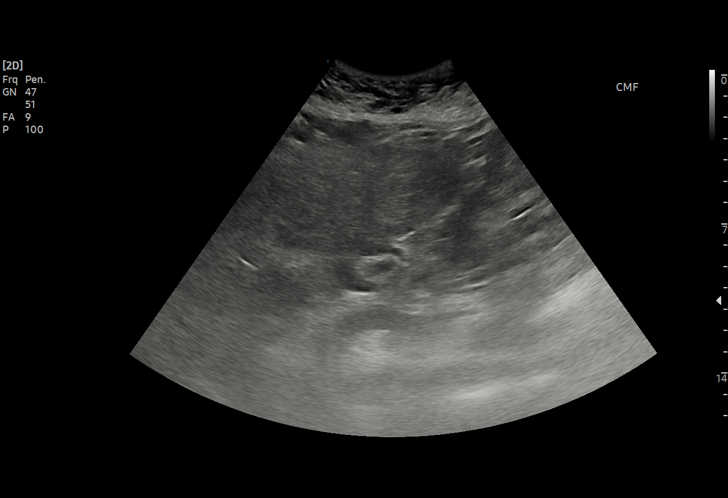
[im 9/31]
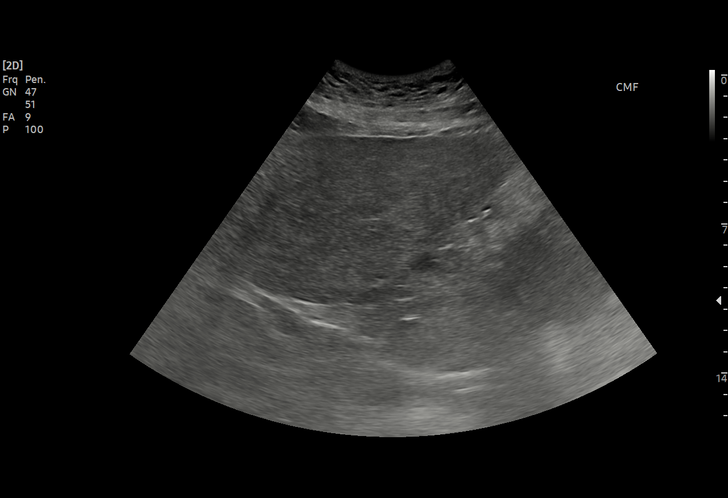
[im 12/31]
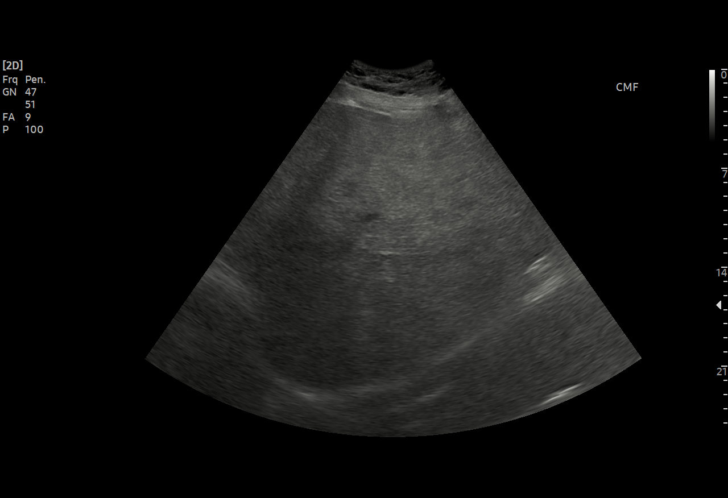
[im 13/31]
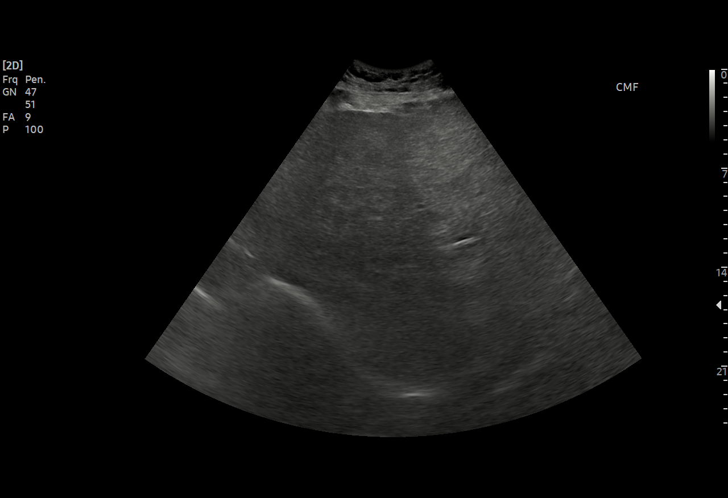
[im 16/31]
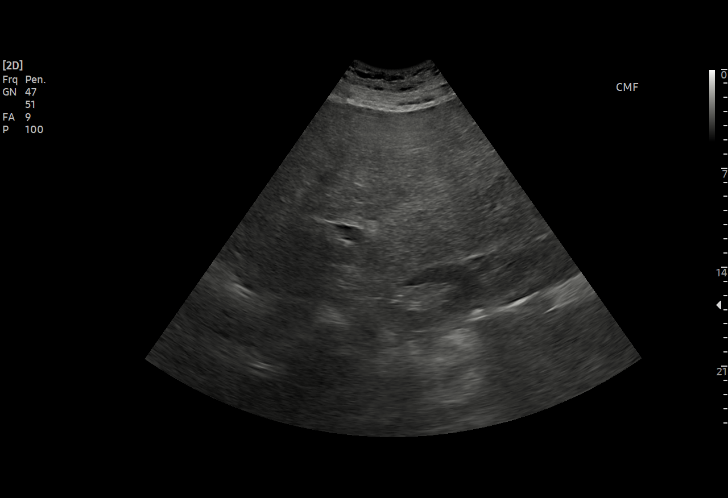
[im 18/31]
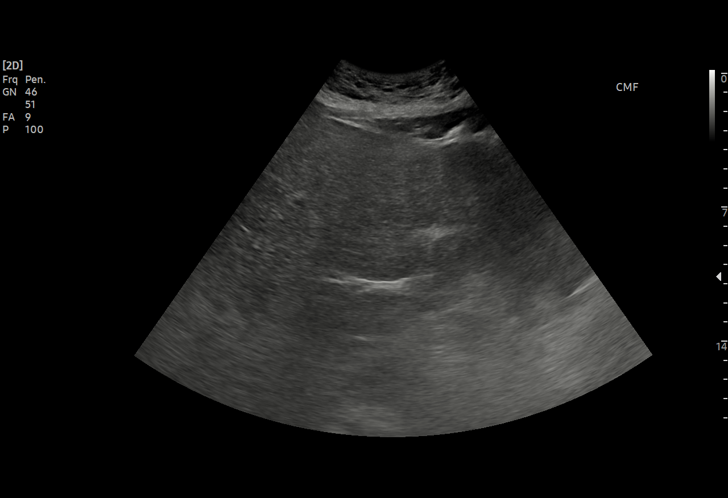
[im 19/31]
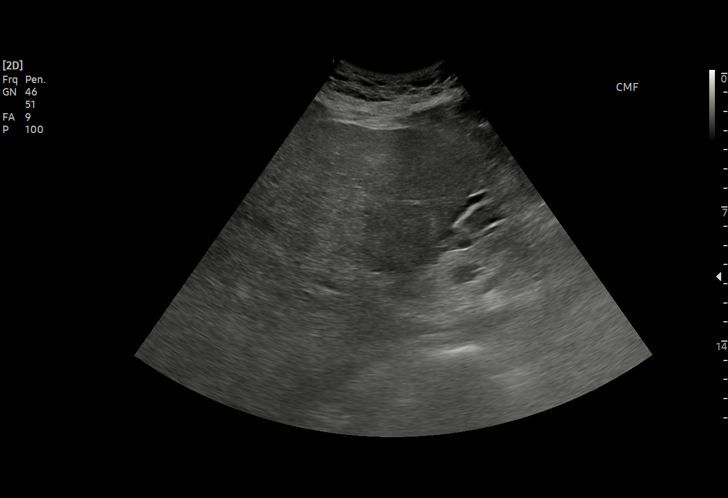
[im 22/31]
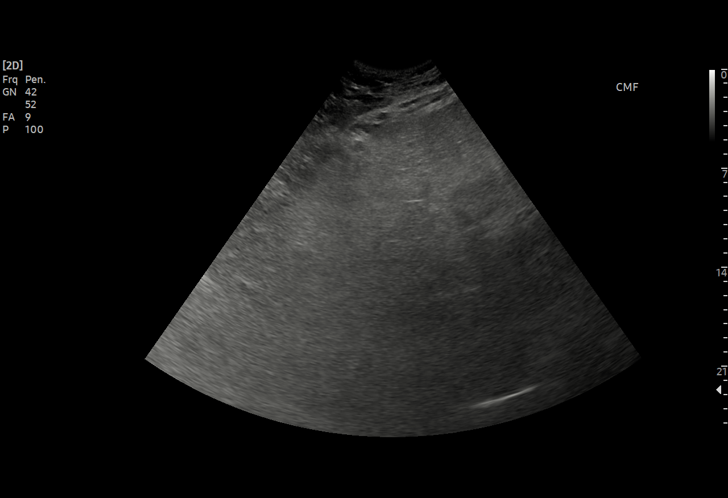
[im 24/31]
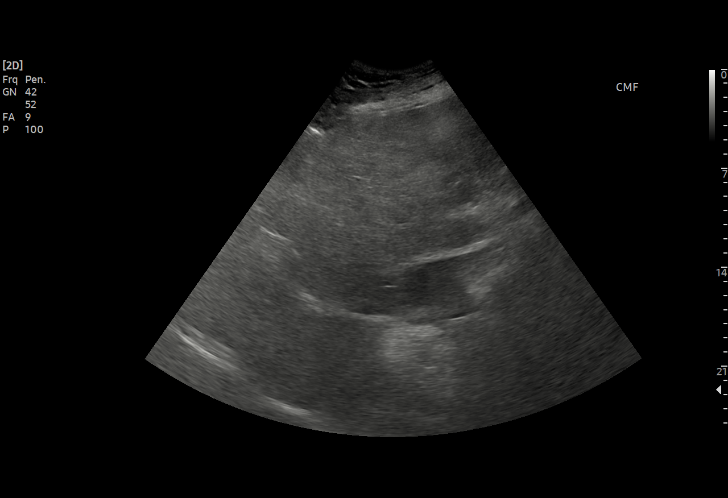
[im 26/31]
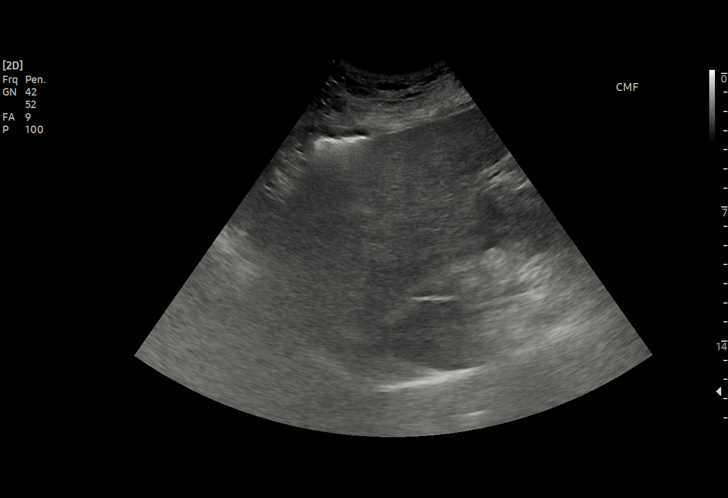
[im 28/31]
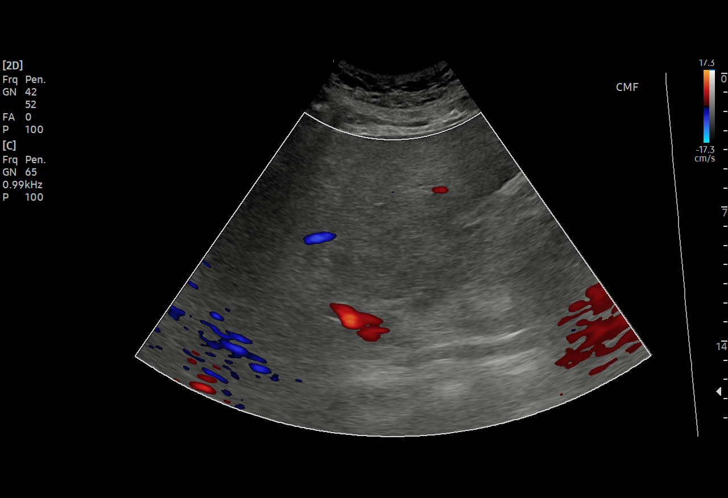
[im 31/31]
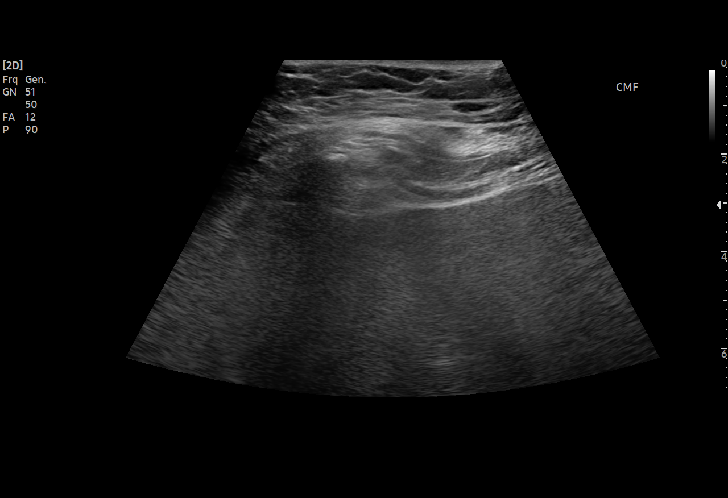

[15 of 25 positions shown; findings below may reference images not displayed]

FINDINGS: Gallbladder:

Surgically absent.

Common bile duct:

Diameter: 4 mm

Liver:

No focal lesion identified. Increased parenchymal echogenicity with
coarsened hepatic echotexture and decreased acoustic through
transmission. No overt hepatic contour nodularity. Portal vein is
patent on color Doppler imaging with normal direction of blood flow
towards the liver.

Other: None.
IMPRESSION: 1. Increased hepatic echogenicity with coarsened hepatic
echotexture. Findings are nonspecific but can be seen in the setting
of hepatic steatosis as well as hepatocellular disease. No overt
hepatic contour nodularity.
2. Status post cholecystectomy.

## 2022-02-18 ENCOUNTER — Ambulatory Visit: Payer: Medicare Other | Admitting: Physician Assistant

## 2022-02-21 NOTE — Progress Notes (Signed)
? ? ?03/02/2022 ?Julie Henderson ?[id number] ?1971/01/11 ? ? ?ASSESSMENT AND PLAN:  ? ?Elevated liver enzymes ?Work on weight loss, likely fatty liver, pending labs for hepatocellular disease.  ?Declines referral to RD/weight loss clinic ?Follow up on labs.  ? ?Dyspepsia ?H pylori negative gastritis, 2 CM hiatal hernia ?Continues to have GERD, vomiting 2-3 x a week ?Declines RD referral.  ?Likely component of gastroparesis with oxycodone, increased in dose last year with worsening symptoms ?Unable to cut back on oxycodone per patient, on gabapentin, has done injections in the past, encouraged patient to talk again with pain management about alternatives. Has not had injection since 2017 in [place].  ?Can get GES versus pH study ? ?S/P gastric bypass ?Normal anastomosis on EGD ? ?Drug-induced constipation ?Will do trial of amitiza to see if this could help her symptoms in general, has BM every 3-4 days.  ? ? ?History of Present Illness:  ?51 y.o. female  with a past medical history of  uterine cancer, macrocytic anemia with B12 deficiency, bipolar 1 disorder, COPD, PTSD, hypertension, depression, migraines, IBS, peripheral arterial disease  and others listed below, known to Dr. Hilarie Fredrickson returns to clinic today for evaluation of constipation, nausea vomiting, dyspepsia. ? ?Patient last seen in the office 01/14/2022 ?01/18/2022 endoscopy for dyspepsia, nausea vomiting with Dr. Hilarie Fredrickson: normal esophagus, 2 cm hiatal hernia, Roux-en-Y anastomosis with healthy-appearing mucosa, gastric biopsies, normal examined jejunum.  H. pylori negative gastritis, negative celiac disease. ?Patient's PPI increased to twice daily Carafate as needed.   ?She is on carafate 4 and PPI BID and she has had lesser episodes, getting once a week.  ?Question component of gastroparesis with opioids, had increase in dose in last year when symptoms did get worse. She is also doing smaller portions but uncertain if this helped.  ?She is still having  vomiting 1-2 x a week and GERD 2-3 x week even with the carafate and PPI BID.  ? ?Patient had hiatal hernia repair at [facility] in 2007, states mesh was recalled. She is wondering if the new hiatal hernia she has is I the same location or not. We do not have prior records.  ? ?At that visit patient had CRP negative, celiac negative, slight thrombocytosis but no anemia or leukocytosis. ?Normal kidney function electrolytes. ?Alkaline phosphatase 163, AST 51, ALT 31, albumin 3.1 ?Patient had right upper quadrant ultrasound that showed status postcholecystectomy increased hepatic echogenicity with coarsened hepatic echotexture nonspecific in setting of hepatic steatosis, no hepatic contour nodularity. ?Pending labs for hepatitis, AMA, antimicrobial, ANA. ? ?[facility] suggested 10-year follow-up 06/06/2028 ? ?Previous GI history: ? ?US Abdomen Limited RUQ (LIVER/GB) ? ?Result Date: 02/07/2022 ?CLINICAL DATA:  Elevated platelets and liver enzymes. EXAM: ULTRASOUND ABDOMEN LIMITED RIGHT UPPER QUADRANT COMPARISON:  None. FINDINGS: Gallbladder: Surgically absent. Common bile duct: Diameter: 4 mm Liver: No focal lesion identified. Increased parenchymal echogenicity with coarsened hepatic echotexture and decreased acoustic through transmission. No overt hepatic contour nodularity. Portal vein is patent on color Doppler imaging with normal direction of blood flow towards the liver. Other: None. IMPRESSION: 1. Increased hepatic echogenicity with coarsened hepatic echotexture. Findings are nonspecific but can be seen in the setting of hepatic steatosis as well as hepatocellular disease. No overt hepatic contour nodularity. 2. Status post cholecystectomy. Electronically Signed   By: Dahlia Bailiff M.D.   On: 02/07/2022 08:21   ? ?Current Medications:  ? ? ?Current Outpatient Medications (Cardiovascular):  ?  furosemide (LASIX) 40 MG tablet, Take  40-80 mg by mouth daily as needed. ? ?Current  Outpatient Medications (Respiratory):  ?  albuterol (VENTOLIN HFA) 108 (90 Base) MCG/ACT inhaler, Inhale 2 puffs into the lungs. Every 4-6 hours as needed ?  promethazine (PHENERGAN) 25 MG tablet, Take 25 mg by mouth 4 (four) times daily as needed. ? ?Current Outpatient Medications (Analgesics):  ?  NURTEC 75 MG TBDP, Take 1 tablet by mouth every other day. ?  Oxycodone HCl 10 MG TABS, Take 1 tablet by mouth. Every 4-6 hours ? ?Current Outpatient Medications (Hematological):  ?  cyanocobalamin (,VITAMIN B-12,) 1000 MCG/ML injection, Inject 1,000 mcg into the muscle once a week. ?  VITRON-C 65-125 MG TABS, Take 2 tablets by mouth daily. ? ?Current Outpatient Medications (Other):  ?  ALPRAZolam (XANAX) 1 MG tablet, Take 1 mg by mouth 4 (four) times daily as needed. ?  B-D 3CC LUER-LOK SYR 25GX1" 25G X 1" 3 ML MISC, USE AS DIRECTED with vitiamin b12 injections ?  buPROPion (WELLBUTRIN SR) 150 MG 12 hr tablet, Take 150 mg by mouth 2 (two) times daily. ?  gabapentin (NEURONTIN) 600 MG tablet, Take 600 mg by mouth 3 (three) times daily. ?  Lactobacillus Rhamnosus, GG, (CULTURELLE PO), Take 1 capsule by mouth daily. ?  LATUDA 60 MG TABS, Take 1 tablet by mouth daily. ?  melatonin 5 MG TABS, Take 5 mg by mouth at bedtime. ?  naloxone (NARCAN) 0.4 MG/ML injection, Inject into the vein. ?  potassium chloride (KLOR-CON) 10 MEQ tablet, Take 10 mEq by mouth as needed. ?  Probiotic Product (PROBIOTIC MULTI-ENZYME PO), Take 1 tablet by mouth daily. ?  QUEtiapine (SEROQUEL) 100 MG tablet, Take 100 mg by mouth at bedtime. ?  risperiDONE (RISPERDAL) 1 MG tablet, Take 1 mg by mouth at bedtime. ?  sucralfate (CARAFATE) 1 g tablet, Take 1 g by mouth 4 (four) times daily. ?  Vitamin D, Ergocalciferol, (DRISDOL) 1.25 MG (50000 UNIT) CAPS capsule, Take 50,000 Units by mouth 2 (two) times a week. ?  esomeprazole (NEXIUM) 40 MG capsule, Take 1 capsule (40 mg total) by mouth 2 (two) times daily before a meal. ? ?Surgical History:  ?She  has a  past surgical history that includes Roux-en-Y Gastric Bypass; Spine surgery (11/07/2014); Cesarean section; Cholecystectomy; Colectomy (2010); Hiatal hernia repair (2007); Partial hysterectomy; Dilation and curettage, diagnostic / therapeutic; Anterior fusion lumbar spine (01/23/2012); spine lumbar direct lateral interbody fusion (06/21/2016); and Appendectomy. ?Family History:  ?Her family history includes Anemia in her mother and sister; Anxiety disorder in her daughter and daughter; Depression in her daughter and daughter; Diabetes in her brother; Hypertension in her brother; Obesity in her mother. ?Social History:  ? reports that she has been smoking cigarettes. She has been smoking an average of 1.5 packs per day. She has never used smokeless tobacco. She reports that she does not currently use alcohol. She reports that she does not currently use drugs. ? ?Current Medications, Allergies, Past Medical History, Past Surgical History, Family History and Social History were reviewed in [employer] record. ? ?Physical Exam: ?Ht 5' 3.5" (1.613 m) Comment: height measured without shoes  Wt 244 lb 2 oz (110.7 kg)   BMI 42.57 kg/m?  ?General:   Pleasant, well developed female in no acute distress ?Eyes: sclerae anicteric,conjunctive pink  ?Heart:  regular rate and rhythm ?Pulm: Clear anteriorly; no wheezing ?Abdomen:  Soft, Obese AB, skin exam normal, Normal bowel sounds. mild tenderness in the epigastrium and in the lower abdomen.  Without guarding and Without rebound, without hepatomegaly. ?Extremities:  Without edema. Peripheral pulses intact.  ?Neurologic:  Alert and  oriented x4;  grossly normal neurologically. ?Skin:   Dry and intact without significant lesions or rashes. ?Psychiatric: Demonstrates good judgement and reason without abnormal affect or behaviors. ? ?Vladimir Crofts, PA-C ?03/02/22 ?

## 2022-03-02 ENCOUNTER — Other Ambulatory Visit (INDEPENDENT_AMBULATORY_CARE_PROVIDER_SITE_OTHER): Payer: Medicare Other

## 2022-03-02 ENCOUNTER — Telehealth: Payer: Self-pay

## 2022-03-02 ENCOUNTER — Encounter: Payer: Self-pay | Admitting: Physician Assistant

## 2022-03-02 ENCOUNTER — Ambulatory Visit: Payer: Medicare Other | Admitting: Physician Assistant

## 2022-03-02 VITALS — BP 110/74 | HR 126 | Ht 63.5 in | Wt 244.1 lb

## 2022-03-02 DIAGNOSIS — R7989 Other specified abnormal findings of blood chemistry: Secondary | ICD-10-CM | POA: Diagnosis not present

## 2022-03-02 DIAGNOSIS — K5903 Drug induced constipation: Secondary | ICD-10-CM

## 2022-03-02 DIAGNOSIS — R1013 Epigastric pain: Secondary | ICD-10-CM | POA: Diagnosis not present

## 2022-03-02 DIAGNOSIS — R748 Abnormal levels of other serum enzymes: Secondary | ICD-10-CM

## 2022-03-02 DIAGNOSIS — Z9884 Bariatric surgery status: Secondary | ICD-10-CM

## 2022-03-02 LAB — HEPATIC FUNCTION PANEL
ALT: 23 U/L (ref 0–35)
AST: 39 U/L — ABNORMAL HIGH (ref 0–37)
Albumin: 3.3 g/dL — ABNORMAL LOW (ref 3.5–5.2)
Alkaline Phosphatase: 123 U/L — ABNORMAL HIGH (ref 39–117)
Bilirubin, Direct: 0.2 mg/dL (ref 0.0–0.3)
Total Bilirubin: 0.6 mg/dL (ref 0.2–1.2)
Total Protein: 6.7 g/dL (ref 6.0–8.3)

## 2022-03-02 MED ORDER — LUBIPROSTONE 24 MCG PO CAPS: 24.0000 ug | ORAL_CAPSULE | Freq: Two times a day (BID) | ORAL | 3 refills | Status: AC

## 2022-03-02 NOTE — Patient Instructions (Addendum)
Can try amitiza twice a day for constipation ? ?Can see RD or weight loss management at some point ? ?Gastroparesis ?Please do small frequent meals like 4-6 meals a day.  ?Eat and drink liquids at separate times.  ?Avoid high fiber foods, cook your vegetables, avoid high fat food.  ?Suggest spreading protein throughout the day (greek yogurt, glucerna, soft meat, milk, eggs) ?Choose soft foods that you can mash with a fork ?When you are more symptomatic, change to pureed foods foods and liquids.  ?Consider reading "Living well with Gastroparesis" by Lambert Keto ?Gastroparesis is a condition in which food takes longer than normal to empty from the stomach. This condition is also known as delayed gastric emptying. It is usually a long-term (chronic) condition. ?There is no cure, but there are treatments and things that you can do at home to help relieve symptoms. Treating the underlying condition that causes gastroparesis can also help relieve symptoms ?What are the causes? ?In many cases, the cause of this condition is not known. Possible causes include: ?A hormone (endocrine) disorder, such as hypothyroidism or diabetes. ?A nervous system disease, such as Parkinson's disease or multiple sclerosis. ?Cancer, infection, or surgery that affects the stomach or vagus nerve. The vagus nerve runs from your chest, through your neck, and to the lower part of your brain. ?A connective tissue disorder, such as scleroderma. ?Certain medicines. ?What increases the risk? ?You are more likely to develop this condition if: ?You have certain disorders or diseases. These may include: ?An endocrine disorder. ?An eating disorder. ?Amyloidosis. ?Scleroderma. ?Parkinson's disease. ?Multiple sclerosis. ?Cancer or infection of the stomach or the vagus nerve. ?You have had surgery on your stomach or vagus nerve. ?You take certain medicines. ?You are female. ?What are the signs or symptoms? ?Symptoms of this condition include: ?Feeling  full after eating very little or a loss of appetite. ?Nausea, vomiting, or heartburn. ?Bloating of your abdomen. ?Inconsistent blood sugar (glucose) levels on blood tests. ?Unexplained weight loss. ?Acid from the stomach coming up into the esophagus (gastroesophageal reflux). ?Sudden tightening (spasm) of the stomach, which can be painful. ?Symptoms may come and go. Some people may not notice any symptoms. ?How is this diagnosed? ?This condition is diagnosed with tests, such as: ?Tests that check how long it takes food to move through the stomach and intestines. These tests include: ?Upper gastrointestinal (GI) series. For this test, you drink a liquid that shows up well on X-rays, and then X-rays are taken of your intestines. ?Gastric emptying scintigraphy. For this test, you eat food that contains a small amount of radioactive material, and then scans are taken. ?Wireless capsule GI monitoring system. For this test, you swallow a pill (capsule) that records information about how foods and fluid move through your stomach. ?Gastric manometry. For this test, a tube is passed down your throat and into your stomach to measure electrical and muscular activity. ?Endoscopy. For this test, a long, thin tube with a camera and light on the end is passed down your throat and into your stomach to check for problems in your stomach lining. ?Ultrasound. This test uses sound waves to create images of the inside of your body. This can help rule out gallbladder disease or pancreatitis as a cause of your symptoms. ?How is this treated? ?There is no cure for this condition, but treatment and home care may relieve symptoms. Treatment may include: ?Treating the underlying cause. ?Managing your symptoms by making changes to your diet and exercise habits. ?Taking medicines  to control nausea and vomiting and to stimulate stomach muscles. ?Getting food through a feeding tube in the hospital. This may be done in severe cases. ?Having  surgery to insert a device called a gastric electrical stimulator into your body. This device helps improve stomach emptying and control nausea and vomiting. ?Follow these instructions at home: ?Take over-the-counter and prescription medicines only as told by your health care provider. ?Follow instructions from your health care provider about eating or drinking restrictions. Your health care provider may recommend that you: ?Eat smaller meals more often. ?Eat low-fat foods. ?Eat low-fiber forms of high-fiber foods. For example, eat cooked vegetables instead of raw vegetables. ?Have only liquid foods instead of solid foods. Liquid foods are easier to digest. ?Drink enough fluid to keep your urine pale yellow. ?Exercise as often as told by your health care provider. ?Keep all follow-up visits. This is important. ?Contact a health care provider if you: ?Notice that your symptoms do not improve with treatment. ?Have new symptoms. ?Get help right away if you: ?Have severe pain in your abdomen that does not improve with treatment. ?Have nausea that is severe or does not go away. ?Vomit every time you drink fluids. ?Summary ?Gastroparesis is a long-term (chronic) condition in which food takes longer than normal to empty from the stomach. ?Symptoms include nausea, vomiting, heartburn, bloating of your abdomen, and loss of appetite. ?Eating smaller portions, low-fat foods, and low-fiber forms of high-fiber foods may help you manage your symptoms. ?Get help right away if you have severe pain in your abdomen. ?This information is not intended to replace advice given to you by your health care provider. Make sure you discuss any questions you have with your health care provider. ?Document Revised: 03/23/2020 Document Reviewed: 03/23/2020 ?Elsevier Patient Education ? [place]. ? ?I appreciate the  opportunity to care for you ? ?Thank You  ? ?Amanda Collier,PA-C ? ?

## 2022-03-02 NOTE — Progress Notes (Signed)
Addendum: ?Reviewed and agree with assessment and management plan. ?If narcotics are long-term/permanent then I would proceed to GES to see if gastroparesis definitely present.  If so this would justify the use of promotility agent which may improve heartburn ?Agree with treating constipation to see if this helps general symptoms. ?Deniese Oberry, Lajuan Lines, MD ? ?

## 2022-03-02 NOTE — Telephone Encounter (Signed)
 Vladimir Crofts, PA-C  , Salvadore Dom, RN ?Could we please schedule a gastric emptying study for nausea and vomiting, we want the patient on the narcotics as these are long term.  ?Thanks  ?

## 2022-03-02 NOTE — Telephone Encounter (Signed)
Per Estill Bamberg, [place] patient will need additional lab work. Spoke with patient & she is aware that she will need to come in for additional lab work at her earliest convenience.  ?

## 2022-03-03 ENCOUNTER — Other Ambulatory Visit: Payer: Self-pay

## 2022-03-03 DIAGNOSIS — R112 Nausea with vomiting, unspecified: Secondary | ICD-10-CM

## 2022-03-03 DIAGNOSIS — R1013 Epigastric pain: Secondary | ICD-10-CM

## 2022-03-03 DIAGNOSIS — R748 Abnormal levels of other serum enzymes: Secondary | ICD-10-CM

## 2022-03-03 DIAGNOSIS — Z9884 Bariatric surgery status: Secondary | ICD-10-CM

## 2022-03-03 DIAGNOSIS — K5903 Drug induced constipation: Secondary | ICD-10-CM

## 2022-03-03 NOTE — Telephone Encounter (Signed)
Patient has been scheduled for GES on 03/16/22 at 7:00 am.  ?

## 2022-03-03 NOTE — Telephone Encounter (Signed)
Orders are in & schedulers have been notified. ?

## 2022-03-03 NOTE — Telephone Encounter (Signed)
Left message for patient to call back  

## 2022-03-06 LAB — IGG: IgG (Immunoglobin G), Serum: 1381 mg/dL (ref 600–1640)

## 2022-03-06 LAB — MITOCHONDRIAL ANTIBODIES: Mitochondrial M2 Ab, IgG: <=20 U (ref <=20.0)

## 2022-03-08 LAB — HEPATITIS PANEL, ACUTE
Hep A IgM: NONREACTIVE
Hep B C IgM: NONREACTIVE
Hepatitis B Surface Ag: NONREACTIVE
Hepatitis C Ab: NONREACTIVE
SIGNAL TO CUT-OFF: 0.09 (ref <1.00)

## 2022-03-08 LAB — ANTI-SMOOTH MUSCLE ANTIBODY, IGG: Actin (Smooth Muscle) Antibody (IGG): <20 U (ref <20)

## 2022-03-08 LAB — HEPATITIS B CORE ANTIBODY, TOTAL: Hep B Core Total Ab: NONREACTIVE

## 2022-03-08 LAB — ANTI-NUCLEAR AB-TITER (ANA TITER): ANA Titer 1: 1:40 {titer} — ABNORMAL HIGH

## 2022-03-08 LAB — ANTI-MICROSOMAL ANTIBODY LIVER / KIDNEY: LKM1 Ab: <=20 U (ref <=20.0)

## 2022-03-08 LAB — HEPATITIS B SURFACE ANTIBODY,QUALITATIVE: Hep B S Ab: NONREACTIVE

## 2022-03-08 LAB — ANA: Anti Nuclear Antibody (ANA): POSITIVE — AB

## 2022-03-10 ENCOUNTER — Telehealth: Payer: Self-pay | Admitting: Physician Assistant

## 2022-03-10 NOTE — Telephone Encounter (Signed)
Patient returned call from last week. She has already been scheduled for GES. Patient notified of her lab results. ?

## 2022-03-10 NOTE — Telephone Encounter (Signed)
Inbound call from patient stating that she was returning a call. Please advise.  ?

## 2022-03-16 ENCOUNTER — Encounter (HOSPITAL_COMMUNITY): Payer: Medicare Other | Attending: Physician Assistant

## 2022-05-06 ENCOUNTER — Other Ambulatory Visit: Payer: Self-pay | Admitting: Physician Assistant

## 2022-05-06 DIAGNOSIS — R1013 Epigastric pain: Secondary | ICD-10-CM

## 2022-05-06 DIAGNOSIS — R112 Nausea with vomiting, unspecified: Secondary | ICD-10-CM

## 2022-05-20 ENCOUNTER — Telehealth: Payer: Self-pay | Admitting: Physician Assistant

## 2022-05-20 NOTE — Telephone Encounter (Signed)
Spoke with patient regarding lab results from April. She had already been notified & acknowledge results, however her PCP asked that she follow up in regards to next steps. Patient has a f/u with Dr. Rhea Belton next week, and has been advised to keep that appointment so that she can address her concerns with MD. She also mentioned she was unable to attend GES, due to the cost upfront.

## 2022-05-25 ENCOUNTER — Ambulatory Visit: Payer: Medicare Other | Admitting: Internal Medicine

## 2022-08-16 ENCOUNTER — Other Ambulatory Visit: Payer: Self-pay | Admitting: Physician Assistant

## 2022-08-16 DIAGNOSIS — R112 Nausea with vomiting, unspecified: Secondary | ICD-10-CM

## 2022-08-16 DIAGNOSIS — R1013 Epigastric pain: Secondary | ICD-10-CM

## 2022-11-14 ENCOUNTER — Encounter (HOSPITAL_COMMUNITY): Payer: Self-pay

## 2022-12-07 ENCOUNTER — Other Ambulatory Visit: Payer: Self-pay | Admitting: Physician Assistant

## 2022-12-07 DIAGNOSIS — R1013 Epigastric pain: Secondary | ICD-10-CM

## 2022-12-07 DIAGNOSIS — R112 Nausea with vomiting, unspecified: Secondary | ICD-10-CM

## 2023-02-24 ENCOUNTER — Other Ambulatory Visit: Payer: Self-pay | Admitting: Physician Assistant

## 2023-02-24 DIAGNOSIS — R112 Nausea with vomiting, unspecified: Secondary | ICD-10-CM

## 2023-02-24 DIAGNOSIS — R1013 Epigastric pain: Secondary | ICD-10-CM

## 2023-05-11 ENCOUNTER — Other Ambulatory Visit: Payer: Self-pay | Admitting: Physician Assistant

## 2023-05-11 DIAGNOSIS — R1013 Epigastric pain: Secondary | ICD-10-CM

## 2023-05-11 DIAGNOSIS — R112 Nausea with vomiting, unspecified: Secondary | ICD-10-CM

## 2023-08-10 ENCOUNTER — Other Ambulatory Visit: Payer: Self-pay | Admitting: Physician Assistant

## 2023-08-10 DIAGNOSIS — R112 Nausea with vomiting, unspecified: Secondary | ICD-10-CM

## 2023-08-10 DIAGNOSIS — R1013 Epigastric pain: Secondary | ICD-10-CM

## 2023-08-10 NOTE — Telephone Encounter (Signed)
Contacted pt & was unable to LVM due to mailbox being full.

## 2023-09-28 ENCOUNTER — Telehealth: Payer: Self-pay | Admitting: Physician Assistant

## 2023-09-28 NOTE — Telephone Encounter (Signed)
Inbound call from [facility] calling in regards to patient's appointment on 12/6. They state patient is in lvier failure and would like for patient to be seen as as possible with either a PA or Dr. Rhea Belton.

## 2023-09-29 NOTE — Telephone Encounter (Signed)
Detailed message left on pt identified voicemail regarding appt.

## 2023-09-29 NOTE — Telephone Encounter (Signed)
Pt scheduled to see Alcide Evener NP 10/09/23 at 11:30am.

## 2023-10-09 ENCOUNTER — Ambulatory Visit: Payer: Medicare Other | Admitting: Nurse Practitioner

## 2023-10-09 NOTE — Progress Notes (Deleted)
     10/09/2023 Julie Henderson [id number] Apr 24, 1971   Chief Complaint: Elevated LFTs  History of Present Illness: Julie Henderson is a 52 year old female with a past medical history of      Latest Ref Rng & Units 03/02/2022   12:56 PM 01/18/2022    8:57 AM  Hepatic Function  Total Protein 6.0 - 8.3 g/dL 6.7  7.2   Albumin 3.5 - 5.2 g/dL 3.3  3.1   AST 0 - 37 U/L 39  51   ALT 0 - 35 U/L 23  31   Alk Phosphatase 39 - 117 U/L 123  163   Total Bilirubin 0.2 - 1.2 mg/dL 0.6  0.6   Bilirubin, Direct 0.0 - 0.3 mg/dL 0.2           Latest Ref Rng & Units 03/02/2022   12:56 PM 01/18/2022    8:57 AM  CMP  Glucose 70 - 99 mg/dL  88   BUN 6 - 23 mg/dL  3   Creatinine 4.09 - 1.20 mg/dL  8.11   Sodium 914 - 782 mEq/L  138   Potassium 3.5 - 5.1 mEq/L  4.0   Chloride 96 - 112 mEq/L  99   CO2 19 - 32 mEq/L  30   Calcium 8.4 - 10.5 mg/dL  9.1   Total Protein 6.0 - 8.3 g/dL 6.7  7.2   Total Bilirubin 0.2 - 1.2 mg/dL 0.6  0.6   Alkaline Phos 39 - 117 U/L 123  163   AST 0 - 37 U/L 39  51   ALT 0 - 35 U/L 23  31        Latest Ref Rng & Units 01/18/2022    8:57 AM  CBC  WBC 4.0 - 10.5 K/uL 7.9   Hemoglobin 12.0 - 15.0 g/dL 95.6   Hematocrit 21.3 - 46.0 % 41.0   Platelets 150.0 - 400.0 K/uL 440.0        EGD 01/18/2022 by Dr. Rhea Belton: - Normal mucosa was found in the entire esophagus.  - 2 cm hiatal hernia.  - Roux-en-Y gastrojejunostomy with gastrojejunal anastomosis characterized by healthy appearing mucosa. Gastric biopsies obtained.  - Normal examined jejunum. Biopsied.  [facility]  Normal post-RYGB anatomy without evidence of marginal ulcer, normal colonoscopy. Suggest 10 year follow up colon, possible capsule endoscopy for anemia but never done.   Current Medications, Allergies, Past Medical History, Past Surgical History, Family History and Social History were reviewed in [employer] record.   Review of Systems:   Constitutional:  Negative for fever, sweats, chills or weight loss.  Respiratory: Negative for shortness of breath.   Cardiovascular: Negative for chest pain, palpitations and leg swelling.  Gastrointestinal: See HPI.  Musculoskeletal: Negative for back pain or muscle aches.  Neurological: Negative for dizziness, headaches or paresthesias.    Physical Exam: There were no vitals taken for this visit. General: in no acute distress. Head: Normocephalic and atraumatic. Eyes: No scleral icterus. Conjunctiva pink . Ears: Normal auditory acuity. Mouth: Dentition intact. No ulcers or lesions.  Lungs: Clear throughout to auscultation. Heart: Regular rate and rhythm, no murmur. Abdomen: Soft, nontender and nondistended. No masses or hepatomegaly. Normal bowel sounds x 4 quadrants.  Rectal: *** Musculoskeletal: Symmetrical with no gross deformities. Extremities: No edema. Neurological: Alert oriented x 4. No focal deficits.  Psychological: Alert and cooperative. Normal mood and affect  Assessment and Recommendations: ***

## 2023-11-03 ENCOUNTER — Ambulatory Visit: Payer: Medicare Other | Admitting: Gastroenterology

## 2023-11-29 DEATH — deceased

## 2023-12-12 ENCOUNTER — Ambulatory Visit: Payer: Medicare Other | Admitting: Physician Assistant
